# Patient Record
Sex: Male | Born: 1960 | Race: White | Hispanic: No | Marital: Married | Smoking: Former smoker
Health system: Southern US, Community
[De-identification: ages and names within clinical notes are randomized; demographics above are authoritative.]

## PROBLEM LIST (undated history)

## (undated) DIAGNOSIS — T8859XA Other complications of anesthesia, initial encounter: Secondary | ICD-10-CM

## (undated) DIAGNOSIS — E785 Hyperlipidemia, unspecified: Secondary | ICD-10-CM

## (undated) DIAGNOSIS — F419 Anxiety disorder, unspecified: Secondary | ICD-10-CM

## (undated) DIAGNOSIS — Z9889 Other specified postprocedural states: Secondary | ICD-10-CM

## (undated) DIAGNOSIS — R112 Nausea with vomiting, unspecified: Secondary | ICD-10-CM

## (undated) DIAGNOSIS — T4145XA Adverse effect of unspecified anesthetic, initial encounter: Secondary | ICD-10-CM

## (undated) HISTORY — DX: Hyperlipidemia, unspecified: E78.5

## (undated) HISTORY — DX: Anxiety disorder, unspecified: F41.9

## (undated) HISTORY — PX: HERNIA REPAIR: SHX51

---

## 2004-06-18 ENCOUNTER — Ambulatory Visit: Payer: Self-pay | Admitting: Internal Medicine

## 2004-07-28 ENCOUNTER — Ambulatory Visit: Payer: Self-pay | Admitting: Internal Medicine

## 2004-10-06 ENCOUNTER — Ambulatory Visit: Payer: Self-pay | Admitting: Internal Medicine

## 2004-10-29 ENCOUNTER — Ambulatory Visit: Payer: Self-pay | Admitting: Internal Medicine

## 2005-03-02 ENCOUNTER — Ambulatory Visit: Payer: Self-pay | Admitting: Internal Medicine

## 2007-01-16 ENCOUNTER — Telehealth (INDEPENDENT_AMBULATORY_CARE_PROVIDER_SITE_OTHER): Payer: Self-pay | Admitting: *Deleted

## 2007-01-17 ENCOUNTER — Ambulatory Visit: Payer: Self-pay | Admitting: Internal Medicine

## 2007-01-17 DIAGNOSIS — F419 Anxiety disorder, unspecified: Secondary | ICD-10-CM | POA: Insufficient documentation

## 2007-04-25 DIAGNOSIS — Z87898 Personal history of other specified conditions: Secondary | ICD-10-CM

## 2007-04-25 DIAGNOSIS — M542 Cervicalgia: Secondary | ICD-10-CM | POA: Insufficient documentation

## 2008-02-26 ENCOUNTER — Telehealth (INDEPENDENT_AMBULATORY_CARE_PROVIDER_SITE_OTHER): Payer: Self-pay | Admitting: *Deleted

## 2008-04-22 ENCOUNTER — Telehealth (INDEPENDENT_AMBULATORY_CARE_PROVIDER_SITE_OTHER): Payer: Self-pay | Admitting: *Deleted

## 2008-08-07 ENCOUNTER — Ambulatory Visit: Payer: Self-pay | Admitting: Internal Medicine

## 2008-08-07 DIAGNOSIS — IMO0001 Reserved for inherently not codable concepts without codable children: Secondary | ICD-10-CM

## 2010-08-20 ENCOUNTER — Other Ambulatory Visit: Payer: Self-pay | Admitting: Internal Medicine

## 2010-08-26 ENCOUNTER — Telehealth: Payer: Self-pay | Admitting: Internal Medicine

## 2010-08-26 NOTE — Telephone Encounter (Signed)
Patient says his pharmacy = [facility], [address], [place] says that we denied his prescription for Zoloft, but would not tell him why----he took last pill today---please call him as soon as possible with reason and new prescription

## 2010-08-27 MED ORDER — SERTRALINE HCL 50 MG PO TABS: 50.0000 mg | ORAL_TABLET | Freq: Every day | ORAL | Status: DC

## 2010-08-27 NOTE — Telephone Encounter (Signed)
Spoke w/ pt informed last seen 4/10 which is why rx was denied informed that office visit is needed to continue refilling medications. Pt said that he would call sometime next week and schedule appt. Informed pt that this is last refill until appt is scheduled pt ok'd information.

## 2011-05-25 ENCOUNTER — Encounter: Payer: Self-pay | Admitting: Internal Medicine

## 2011-05-25 ENCOUNTER — Ambulatory Visit (INDEPENDENT_AMBULATORY_CARE_PROVIDER_SITE_OTHER): Payer: Self-pay | Admitting: Internal Medicine

## 2011-05-25 DIAGNOSIS — K4090 Unilateral inguinal hernia, without obstruction or gangrene, not specified as recurrent: Secondary | ICD-10-CM

## 2011-05-25 DIAGNOSIS — F411 Generalized anxiety disorder: Secondary | ICD-10-CM

## 2011-05-25 DIAGNOSIS — R030 Elevated blood-pressure reading, without diagnosis of hypertension: Secondary | ICD-10-CM

## 2011-05-25 DIAGNOSIS — F419 Anxiety disorder, unspecified: Secondary | ICD-10-CM

## 2011-05-25 MED ORDER — BUPROPION HCL 75 MG PO TABS: 75.0000 mg | ORAL_TABLET | Freq: Two times a day (BID) | ORAL | Status: DC

## 2011-05-25 NOTE — Progress Notes (Signed)
  Subjective:    Patient ID: Trevor Hurst, male    DOB: 22-Feb-1961, 51 y.o.   MRN: [id number]  HPI  Hernia: Onset:03/23/11 Trigger/injury:noted incidentally in shower No pain, redness. Slight swelling ; more noticeable with cough Constitutional:No fever, chills, sweats, weight change Heme: No abnormal bruising or clotting, lymphadenopathy Treatment/response:none  Past medical history includes herniorrhaphy about 15 years ago on same side    Review of Systems He denies any genitourinary symptoms such as hematuria, pyuria, or dysuria. See blood pressure; he has never had hypertension. His blood pressure was 128/88 at the [employer] recently.  He weaned himself off sertraline in August of 2012. After that he started smoking in October because of work-related stresses. He did not feel that the sertraline was effective any longer. He has stopped smoking and is using a nicotine patch. He is questioning an alternative to sertraline. At this time he notes anxiety at work with slight panic and a feeling of diffuse heat. He denies a constellation of headache, chest pain, flushing, and diarrhea      Objective:   Physical Exam Gen.: Healthy and well-nourished in appearance. Alert, appropriate and cooperative throughout exam. Lungs: Normal respiratory effort; chest expands symmetrically. Lungs are clear to auscultation without rales, wheezes, or increased work of breathing. Heart: Normal rate and rhythm. Normal S1 and S2. No gallop, click, or rub. S 4 w/o  murmur. Abdomen: Bowel sounds normal; abdomen soft and nontender. No masses, organomegaly or hernias noted. Genitalia: Large direct hernia on the right which is partially reducible. 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                                                                                   Musculoskeletal/extremities:  No clubbing, cyanosis, edema, or deformity noted. Joints normal. Nail health  good. Vascular: Carotid, radial artery,  dorsalis pedis and  posterior tibial pulses are full and equal. No bruits present. No AAA Neurologic: Alert and oriented x3.  Skin: Intact without suspicious lesions or rashes. Lymph: No cervical, axillary, or inguinal lymphadenopathy present. Psych: Mood and affect are normal. Normally interactive                                                                                         Assessment & Plan:   #1 large direct hernia on the right; options discussed. He plans to consider surgery in the Spring   #2 elevated blood pressure without history of hypertension. Monitor is needed. Anxiety may be a component in the blood pressure changes.  #3 anxiety, job related.  Plan: See orders &  recommendations

## 2011-05-25 NOTE — Patient Instructions (Addendum)
Blood Pressure Goal  Ideally is an AVERAGE < 135/85. This AVERAGE should be calculated from @ least 5-7 BP readings taken @ different times of day on different days of week. You should not respond to isolated BP readings , but rather the AVERAGE for that week . Avoid heavy lifting because of  the hernia. Elective surgery can be scheduled whenever you would want to proceed. Please be seen immediately if there is persistent had severe pain in this area.

## 2011-06-07 ENCOUNTER — Ambulatory Visit (INDEPENDENT_AMBULATORY_CARE_PROVIDER_SITE_OTHER): Payer: PRIVATE HEALTH INSURANCE | Admitting: Internal Medicine

## 2011-06-07 ENCOUNTER — Encounter: Payer: Self-pay | Admitting: Internal Medicine

## 2011-06-07 DIAGNOSIS — F411 Generalized anxiety disorder: Secondary | ICD-10-CM

## 2011-06-07 DIAGNOSIS — G479 Sleep disorder, unspecified: Secondary | ICD-10-CM

## 2011-06-07 DIAGNOSIS — G478 Other sleep disorders: Secondary | ICD-10-CM

## 2011-06-07 LAB — T3, FREE: T3, Free: 3.5 pg/mL (ref 2.3–4.2)

## 2011-06-07 LAB — T4, FREE: Free T4: 0.93 ng/dL (ref 0.60–1.60)

## 2011-06-07 LAB — TSH: TSH: 0.56 u[IU]/mL (ref 0.35–5.50)

## 2011-06-07 MED ORDER — SERTRALINE HCL 50 MG PO TABS: 50.0000 mg | ORAL_TABLET | Freq: Every day | ORAL | Status: DC

## 2011-06-07 NOTE — Patient Instructions (Signed)
To prevent sleep dysfunction follow these instructions for sleep hygiene. Do not read, watch TV, or eat in bed. Do not get into bed until you are ready to turn off the light &  to go to sleep. Do not ingest stimulants ( decongestants, diet pills, nicotine, caffeine) after the evening meal.  The  24 hr urine studies will check for  increased production of stress hormones (metanephrines and catecholamines).

## 2011-06-07 NOTE — Progress Notes (Signed)
  Subjective:    Patient ID: Trevor Hurst, male    DOB: 10-02-1960, 51 y.o.   MRN: [id number]  HPI He feels that the bupropion has not been effective although his anxiety has decreased slightly with his medication. He describes an anxiety attack  2/2 & 2/3 with a "scared feeling". He denied associated chest pain, palpitations,flushing, diarrhea or shortness of breath. In fact he was able to walk 4 miles without any of these symptoms. Now in retrospect he feels the Zoloft has been effective . At the last visit he told me that it had lost it's  effectiveness; he now believes that the response to Zoloft was " fantastic". He has remained a nonsmoker. He does describe nightmares and sleep disruption. Blood pressure has been well-controlled in the range of 120- 130/80-88. There is no personal or family history of thyroid disorder. His sister does have depression.      Review of Systems  Abnormal sleep pattern Onset:1/31 Difficulty going to sleep:no Frequent awakening:no Early awakening:yes, 3-3:30 am Nightmares:yes Abnormal leg movement:no Snoring:no Apnea:no Risk factors/sleep hygiene: Stimulants:no Alcohol intake:no Reading, watching TV, eating @ bedtime:wife watches TV Daytime naps:no Stress/anxiety:work stress unchanged Work/travel factors:no Impact: Daytime hypersomnolence: no Motor vehicle accident/motor dysfunction:no Treatment to date/efficacy: no Rx      Objective:   Physical Exam  Gen.:  well-nourished; in no acute distress Eyes: Extraocular motion intact; no lid lag or proptosis Heart: Normal rhythm and rate without significant murmur, gallop, or extra heart sounds Neck: thyroid normal Lungs: Chest clear to auscultation without rales,rales, wheezes Neuro:Deep tendon reflexes are equal and within normal limits; no tremor  Skin: Warm and dry without significant lesions or rashes; no onycholysis Psych: Normally communicative and interactive; no abnormal mood or affect  clinically.         Assessment & Plan:

## 2011-06-07 NOTE — Assessment & Plan Note (Signed)
Symptoms he describes and suboptimal response to bupropion suggest significant serotonin deficiency. This is supported by his history of significant response to sertraline in the past. Surgery will be initiated and titrated to 50-100 mg a day as per response.  The pathophysiology of serotonin deficiency was discussed. Thyroid function tests and 24-hour urine for catecholamines and metanephrines indicated.

## 2011-06-14 ENCOUNTER — Telehealth: Payer: Self-pay

## 2011-06-14 NOTE — Telephone Encounter (Signed)
Patient made aware of labs and he voiced understanding    KP

## 2011-06-14 NOTE — Telephone Encounter (Signed)
Message copied by Maurice Small on Mon Jun 14, 2011  9:04 AM ------      Message from: Pecola Lawless      Created: Sat Jun 12, 2011 10:30 AM       TFTs normal;urine  stress hormone levels pending

## 2011-06-14 NOTE — Telephone Encounter (Signed)
Left message on voicemail for patient to return call when available   

## 2011-06-18 ENCOUNTER — Ambulatory Visit (INDEPENDENT_AMBULATORY_CARE_PROVIDER_SITE_OTHER): Payer: PRIVATE HEALTH INSURANCE | Admitting: Internal Medicine

## 2011-06-18 ENCOUNTER — Encounter: Payer: Self-pay | Admitting: Internal Medicine

## 2011-06-18 VITALS — BP 142/88 | HR 88 | Temp 98.2°F | Wt 231.1 lb

## 2011-06-18 DIAGNOSIS — R000 Tachycardia, unspecified: Secondary | ICD-10-CM

## 2011-06-18 DIAGNOSIS — F410 Panic disorder [episodic paroxysmal anxiety] without agoraphobia: Secondary | ICD-10-CM

## 2011-06-18 DIAGNOSIS — F411 Generalized anxiety disorder: Secondary | ICD-10-CM

## 2011-06-18 MED ORDER — LORAZEPAM 0.5 MG PO TABS: 0.5000 mg | ORAL_TABLET | Freq: Three times a day (TID) | ORAL | Status: AC | PRN

## 2011-06-18 NOTE — Assessment & Plan Note (Signed)
Lorazepam 0.5 every 8-12 hours as needed. 24 urines for catecholamines and metanephrines will be collected

## 2011-06-18 NOTE — Patient Instructions (Signed)
Avoid stimulants such as decongestants, diet pills, nicotine, or caffeine (coffee, tea, cola, or chocolate) to excess.

## 2011-06-18 NOTE — Progress Notes (Signed)
  Subjective:    Patient ID: Trevor Hurst, male    DOB: 1960/11/19, 51 y.o.   MRN: [id number]  HPI He titrated the sertraline 50 mg a day but developed paroxysmal sweating , agitation & marked sleep disturbance. This was the same phenomena noted with Wellbutrin  when it was titrated.  He describes anorexia with the anxiety episodes. Thyroid function tests were normal.  He has not collected a 24 urine for catecholamines and metanephrines as yet.    Review of Systems he denies a constellation of headaches, chest pain, flushing, and diarrhea.     Objective:   Physical Exam  Gen.:  well-nourished; in no acute distress Eyes: Extraocular motion intact; no lid lag or proptosis Neck: supple ; thyroid normal  Heart: Normal rhythm and rate without significant murmur, gallop, or extra heart sounds Lungs: Chest clear to auscultation without rales,rales, wheezes Neuro:Deep tendon reflexes are equal, brisk  & 1& 1/2 +; fine hand tremor  Skin: Warm and dry without significant lesions or rashes; no onycholysis Psych: Normally communicative and interactive; no abnormal mood or affect clinically.  MDQ screening : only 2 + answers        Assessment & Plan:  #1 anxiety disorder with paradoxic response to Wellbutrin as well as sertraline. The Mood Disorder questionnaire is negative with only 3 positive answers.  Plan: As necessary lorazepam for anxiety. I asked him to collect 24 urine 2/17 to-2/18 for catecholamines and metanephrines.

## 2011-06-21 NOTE — Progress Notes (Signed)
Addended by: Silvio Pate D on: 06/21/2011 04:42 PM   Modules accepted: Orders

## 2011-06-24 LAB — METANEPHRINES, URINE, 24 HOUR
Metaneph Total, Ur: 329 mcg/24 h (ref 182–739)
Normetanephrine, 24H Ur: 171 mcg/24 h (ref 88–649)

## 2011-06-26 LAB — CATECHOLAMINES, FRACTIONATED, URINE, 24 HOUR
Creatinine, Urine mg/day-CATEUR: 2.06 g/(24.h) (ref 0.63–2.50)
Epinephrine, 24 hr Urine: 7 mcg/24 h (ref 2–24)
Norepinephrine, 24 hr Ur: 27 mcg/24 h (ref 15–100)

## 2011-07-22 ENCOUNTER — Other Ambulatory Visit: Payer: Self-pay | Admitting: Internal Medicine

## 2011-07-22 NOTE — Telephone Encounter (Signed)
OK #30, R X2 

## 2011-07-22 NOTE — Telephone Encounter (Signed)
Refill for  Ativan 0.5mg  tab val Last filled 3.15.13 Last qty given 30

## 2011-07-22 NOTE — Telephone Encounter (Signed)
Last OV 06/18/2011, med last filled on 06/18/2011 #30 with 2 refills  Please advise if ok to fill and if yes how many additional refills

## 2011-07-23 MED ORDER — LORAZEPAM 0.5 MG PO TABS: 0.5000 mg | ORAL_TABLET | Freq: Three times a day (TID) | ORAL | Status: DC | PRN

## 2011-07-23 NOTE — Telephone Encounter (Signed)
RX sent

## 2011-09-15 ENCOUNTER — Telehealth: Payer: Self-pay | Admitting: Internal Medicine

## 2011-09-15 DIAGNOSIS — K469 Unspecified abdominal hernia without obstruction or gangrene: Secondary | ICD-10-CM

## 2011-09-15 NOTE — Telephone Encounter (Signed)
Patient needs to be seen tomorrow. Please arrange (here or @ [facility])

## 2011-09-15 NOTE — Telephone Encounter (Signed)
Order placed

## 2011-09-15 NOTE — Telephone Encounter (Signed)
Please  arrange a surgical referral w/  Dr.Gerkin

## 2011-09-15 NOTE — Telephone Encounter (Signed)
Caller: Cabe/Patient; PCP: Marga Melnick; CB#: [phone]; ; ; Call regarding Hernia Pain, Waiting On Referral; Called in to office this AM for referral, was told that if he had any problems or pain in that area to let MD know. Wants to know if he is ok to wait until he hears from the MD. Hernia is below belt line to groin area to right side. Onset 06/2011. Has bothered him on and off today. No pain at time of call back. Afebrile. All emergent sxs per Swollen Lymph Nodes protocol r/o. Home care advice given. Advised to FU with office in AM.

## 2011-09-15 NOTE — Telephone Encounter (Signed)
Pt states he is ready to have surgery on his hernia. He cannot remember the name of the doctor that was suggested (Gerkin?), but would [place] to proceed with scheduling him.

## 2011-09-15 NOTE — Telephone Encounter (Signed)
This message is ok to wait for Dr.Hopper, Dr.Hopper please advise

## 2011-09-16 ENCOUNTER — Ambulatory Visit (INDEPENDENT_AMBULATORY_CARE_PROVIDER_SITE_OTHER): Payer: PRIVATE HEALTH INSURANCE | Admitting: Internal Medicine

## 2011-09-16 VITALS — BP 148/90 | HR 94 | Temp 98.1°F | Wt 235.0 lb

## 2011-09-16 DIAGNOSIS — K469 Unspecified abdominal hernia without obstruction or gangrene: Secondary | ICD-10-CM

## 2011-09-16 NOTE — Progress Notes (Signed)
  Subjective:    Patient ID: Trevor Hurst, male    DOB: 1961-02-13, 51 y.o.   MRN: [id number]  HPI Acute visit, will see surgery in reference to the right inguinal hernia on June 20 but the patient was concerned because he had some mild pain in the area yesterday. Pain is spontaneously resolved within minutes. He noted the hernia around January 2013 and since then it has not grown  PMH: anxiety  Review of Systems Denies fever or chills No nausea vomiting or abdominal pain. Denies testicular pain.    Objective:   Physical Exam General -- alert, well-developed. No apparent distress.  GU-- Scrotal contents normal to palpation, penis normal as well. He does have a reducible, 4x3 cm hernia in the right suprapubic area. Nontender. Neurologic-- alert & oriented X3 and strength normal in all extremities. Psych-- Cognition and judgment appear intact. Alert and cooperative with normal attention span and concentration.  not anxious appearing and not depressed appearing.       Assessment & Plan:   Right inguinal hernia, to see surgery 10/21/2011. Discussed with the patient  symptoms such as sudden enlargement, pain, nausea vomiting or stomach discomfort. If he has any of those he will go to the emergency room for eval  of possible strangulation. He verbalized understanding

## 2011-09-16 NOTE — Telephone Encounter (Signed)
I called patient and offered appointment to be seen today at 11:15 by Dr.Paz. Patient will verify this is ok with his work schedule and if not patient will call back to cancel or reschedule

## 2011-09-30 ENCOUNTER — Telehealth: Payer: Self-pay | Admitting: Internal Medicine

## 2011-09-30 MED ORDER — LORAZEPAM 0.5 MG PO TABS: 0.5000 mg | ORAL_TABLET | Freq: Three times a day (TID) | ORAL | Status: DC | PRN

## 2011-09-30 NOTE — Telephone Encounter (Signed)
Refill: Lorazepam 0.5mg  tab

## 2011-09-30 NOTE — Telephone Encounter (Signed)
RX called in .

## 2011-10-21 ENCOUNTER — Ambulatory Visit (INDEPENDENT_AMBULATORY_CARE_PROVIDER_SITE_OTHER): Payer: 59 | Admitting: Surgery

## 2011-10-21 ENCOUNTER — Encounter (INDEPENDENT_AMBULATORY_CARE_PROVIDER_SITE_OTHER): Payer: Self-pay | Admitting: Surgery

## 2011-10-21 VITALS — BP 150/94 | HR 72 | Temp 98.2°F | Resp 18 | Ht 71.0 in | Wt 234.4 lb

## 2011-10-21 DIAGNOSIS — K4091 Unilateral inguinal hernia, without obstruction or gangrene, recurrent: Secondary | ICD-10-CM | POA: Insufficient documentation

## 2011-10-21 NOTE — Addendum Note (Signed)
Addended by: Andrey Campanile Ismail Graziani M on: 10/21/2011 09:46 AM   Modules accepted: Orders

## 2011-10-21 NOTE — Progress Notes (Addendum)
General Surgery [facility]  Chief Complaint  Patient presents with  . New Evaluation    Inguinal hernia, symptomatic - referral from Dr. Drue Novel and Dr. Alwyn Ren    HISTORY: Patient is a 51 year old white male referred by his primary care physician for symptomatic recurrent right inguinal hernia. Patient underwent repair of right inguinal hernia approximately 12 years ago in [place], [place]. Mesh was used in the repair. In December 2012 the patient noted a bulge in the groin while taking a shower. This has enlarged slightly. Patient was seen and evaluated by his primary physician in February. He has had occasional intermittent burning sensation. He has had no signs or symptoms of obstruction. He presents today for evaluation for repair of recurrent right inguinal hernia.  Past Medical History  Diagnosis Date  . Anxiety      Current Outpatient Prescriptions  Medication Sig Dispense Refill  . LORazepam (ATIVAN) 0.5 MG tablet Take 1 tablet (0.5 mg total) by mouth every 8 (eight) hours as needed.  30 tablet  1     No Known Allergies   Family History  Problem Relation Age of Onset  . Depression Sister   . Cancer Father      History   Social History  . Marital Status: Married    Spouse Name: N/A    Number of Children: N/A  . Years of Education: N/A   Social History Main Topics  . Smoking status: Former Smoker    Quit date: 05/04/2011  . Smokeless tobacco: None  . Alcohol Use: No  . Drug Use: No  . Sexually Active: None   Other Topics Concern  . None   Social History Narrative  . None     REVIEW OF SYSTEMS - PERTINENT POSITIVES ONLY: No signs or symptoms of obstruction. Occasional burning sensation. Slight enlargement over the last 6 months.  EXAM: Filed Vitals:   10/21/11 0857  BP: 150/94  Pulse: 72  Temp: 98.2 F (36.8 C)  Resp: 18    HEENT: normocephalic; pupils equal and reactive; sclerae clear; dentition good; mucous  membranes moist NECK:  symmetric on extension; no palpable anterior or posterior cervical lymphadenopathy; no supraclavicular masses; no tenderness CHEST: clear to auscultation bilaterally without rales, rhonchi, or wheezes CARDIAC: regular rate and rhythm without significant murmur; peripheral pulses are full ABDOMEN: soft without distension; bowel sounds present; no mass; no hepatosplenomegaly; no hernia GU:  Name male without lesion or mass. Obvious bulge in the right groin. Palpation in the inguinal canal shows a moderate-sized inguinal hernia sac. Hernia is reducible. It spontaneously prolapses. It augments with cough and Valsalva. Palpation in the left inguinal canal shows no sign of hernia. EXT:  non-tender without edema; no deformity NEURO: no gross focal deficits; no sign of tremor   LABORATORY RESULTS: See [facility] (CHL-Epic) for most recent results   RADIOLOGY RESULTS: See [facility] (CHL-Epic) for most recent results   IMPRESSION: Recurrent right inguinal hernia, reducible, mildly symptomatic  PLAN: I had a lengthy discussion with the patient regarding his recurrence of his right inguinal hernia. I provided him with written literature on both open and laparoscopic repair. I have asked my partner, Dr. Gaynelle Adu, to consult on the patient today. I think the patient would benefit from laparoscopic inguinal hernia repair. I believe the risk of complications would be less with this approach. I did not perform laparoscopic inguinal hernia repair. I have asked my partner Dr. Gaynelle Adu to take the  patient on his service and performed the repair in the near future. Both the patient and Dr. Andrey Campanile agreed to proceed.  The risks and benefits of the procedure have been discussed at length with the patient.  The patient understands the proposed procedure, potential alternative treatments, and the course of recovery to be expected.  All of the patient's questions have been  answered at this time.  The patient wishes to proceed with surgery.  Velora Heckler, MD, FACS General & Endocrine Surgery [facility]   PT SEEN & EXAMINED. HISTORY AS ABOVE. +RT INGUINAL HERNIA ON EXAM. NO EVID OF LIH  We discussed the etiology of inguinal hernias. We discussed the signs & symptoms of incarceration & strangulation.  We discussed non-operative and operative management.  The patient has elected to proceed with LAPAROSCOPIC REPAIR OF RECURRENT RIGHT INGUINAL HERNIA WITH MESH   I described the procedure in detail.  The patient was given educational material. We discussed the risks and benefits including but not limited to bleeding, infection, chronic inguinal pain, nerve entrapment, hernia recurrence, mesh complications, hematoma formation, urinary retention, injury to the testicles, numbness in the groin, blood clots, injury to the surrounding structures, and anesthesia risk. We also discussed the typical post operative recovery course, including no heavy lifting for 6 weeks. I explained that the likelihood of improvement of their symptoms is GOOD. I explained that he may be at slightly higher risk for chronic nerve pain since this is a recurrence.  Mary Sella. Andrey Campanile, MD, FACS General, Bariatric, & Minimally Invasive Surgery [facility]    Visit Diagnoses: 1. Inguinal hernia recurrent unilateral, right     Primary Care Physician: Marga Melnick, MD

## 2011-10-21 NOTE — Patient Instructions (Signed)
[facility]  UMBILICAL OR INGUINAL HERNIA REPAIR POST OP INSTRUCTIONS  Always review your discharge instruction sheet given to you by the facility where your surgery was performed.  1. A  prescription for pain medication may be given to you upon discharge.  Take your pain medication as prescribed, if needed.  If narcotic pain medicine is not needed, then you may take acetaminophen (Tylenol) or ibuprofen (Advil) as needed. 2. Take your usually prescribed medications unless otherwise directed. 3. If you need a refill on your pain medication, please contact your pharmacy.  They will contact our office to request authorization. Prescriptions will not be filled after 5 pm daily or on weekends. 4. You should follow a light diet the first 24 hours after arrival home, such as soup and crackers, etc.  Be sure to include lots of fluids daily.  Resume your normal diet the day after surgery. 5. Most patients will experience some swelling and bruising around the umbilicus or in the groin and scrotum.  Ice packs and reclining will help.  Swelling and bruising can take several days to resolve.  6. It is common to experience some constipation if taking pain medication after surgery.  Increasing fluid intake and taking a stool softener (such as Colace) will usually help or prevent this problem from occurring.  A mild laxative (Milk of Magnesia or Miralax) should be taken according to package directions if there are no bowel movements after 48 hours. 7. Unless discharge instructions indicate otherwise, you may remove your bandages 24-48 hours after surgery, and you may shower at that time.  You may have steri-strips (small skin tapes) in place directly over the incision.  These strips should be left on the skin for 7-10 days.  If your surgeon used skin glue on the incision, you may shower in 24 hours.  The glue will flake off over the next 2-3 weeks.  Any sutures or staples will be removed at the office  during your follow-up visit. 8. ACTIVITIES:  You may resume regular (light) daily activities beginning the next day--such as daily self-care, walking, climbing stairs--gradually increasing activities as tolerated.  You may have sexual intercourse when it is comfortable.  Refrain from any heavy lifting or straining until approved by your doctor.  You may drive when you are no longer taking prescription pain medication, you can comfortably wear a seatbelt, and you can safely maneuver your car and apply brakes. 9. You should see your doctor in the office for a follow-up appointment approximately 2-3 weeks after your surgery.  Make sure that you call for this appointment within a day or two after you arrive home to insure a convenient appointment time.  WHEN TO CALL YOUR DOCTOR: 1. Fever over 101.0 2. Inability to urinate 3. Nausea and/or vomiting 4. Extreme swelling or bruising 5. Continued bleeding from incision. 6. Increased pain, redness, or drainage from the incision The clinic staff is available to answer your questions during regular business hours.  Please don't hesitate to call and ask to speak to one of the nurses for clinical concerns.  If you have a medical emergency, go to the nearest emergency room or call 911.  A surgeon from [facility] is always on call at the hospital.   [address], [address], [place], [place]  [address]  [address] [address], [place], [place]   [address] [phone] ? [phone] ? FAX [phone] Website: [email or web address]   

## 2011-12-10 ENCOUNTER — Encounter (HOSPITAL_COMMUNITY): Payer: Self-pay | Admitting: Pharmacy Technician

## 2011-12-20 ENCOUNTER — Encounter (HOSPITAL_COMMUNITY)
Admission: RE | Admit: 2011-12-20 | Discharge: 2011-12-20 | Disposition: A | Discharge status: Home / Self Care | Payer: 59 | Source: Ambulatory Visit | Attending: General Surgery | Admitting: General Surgery

## 2011-12-20 ENCOUNTER — Encounter (HOSPITAL_COMMUNITY): Payer: Self-pay

## 2011-12-20 LAB — CBC
Hemoglobin: 16.3 g/dL (ref 13.0–17.0)
MCV: 85.1 fL (ref 78.0–100.0)
Platelets: 178 10*3/uL (ref 150–400)
RBC: 5.36 MIL/uL (ref 4.22–5.81)
WBC: 6 10*3/uL (ref 4.0–10.5)

## 2011-12-20 LAB — BASIC METABOLIC PANEL
CO2: 25 mEq/L (ref 19–32)
Calcium: 9.3 mg/dL (ref 8.4–10.5)
Chloride: 102 mEq/L (ref 96–112)
Glucose, Bld: 98 mg/dL (ref 70–99)
Potassium: 3.9 mEq/L (ref 3.5–5.1)
Sodium: 138 mEq/L (ref 135–145)

## 2011-12-20 LAB — DIFFERENTIAL
Eosinophils Relative: 1 % (ref 0–5)
Lymphocytes Relative: 22 % (ref 12–46)
Lymphs Abs: 1.3 10*3/uL (ref 0.7–4.0)
Monocytes Relative: 8 % (ref 3–12)

## 2011-12-20 LAB — SURGICAL PCR SCREEN
MRSA, PCR: NEGATIVE
Staphylococcus aureus: NEGATIVE

## 2011-12-20 NOTE — Pre-Procedure Instructions (Signed)
Pt bp 175/101, 158/102, and 151-92 at preop visit, pt instructed to monitor bp at home and notify primary md if bp bottom number remains over 90.

## 2011-12-20 NOTE — Patient Instructions (Signed)
20 Trevor Hurst  12/20/2011   Your procedure is scheduled on:  12-24-2011  Report to [facility] at 0530  AM.  Call this number if you have problems the morning of surgery: [phone]   Remember:   Do not eat food or drink liquids:After Midnight.  .  Take these medicines the morning of surgery with A SIP OF WATER: no meds to take   Do not wear jewelry or make up.  Do not wear lotions, powders, or perfumes.Do not wear deodorant.    Do not bring valuables to the hospital.  Contacts, dentures or bridgework may not be worn into surgery.  Leave suitcase in the car. After surgery it may be brought to your room.  For patients admitted to the hospital, checkout time is 11:00 AM the day  discharge                             Special Instructions: CHG Shower Use Special Wash: 1/2 bottle night before surgery and 1/2 bottle morning of surgery, use regular soap on face and front and back private area. You may shave your face morning of surgery.   Please read over the following fact sheets that you were given: MRSA Information  [facility] pre op nurse phone number [phone], call if needed

## 2011-12-20 NOTE — Pre-Procedure Instructions (Signed)
Pt given pre op instructions using the teach back method  

## 2011-12-23 HISTORY — PX: OTHER SURGICAL HISTORY: SHX169

## 2011-12-23 MED ORDER — CEFAZOLIN SODIUM-DEXTROSE 2-3 GM-% IV SOLR
2.0000 g | INTRAVENOUS | Status: AC
  Administered 2011-12-24: 2 g via INTRAVENOUS

## 2011-12-24 ENCOUNTER — Encounter (HOSPITAL_COMMUNITY)
Admission: RE | Disposition: A | Discharge status: Home / Self Care | Payer: Self-pay | Source: Ambulatory Visit | Attending: General Surgery

## 2011-12-24 ENCOUNTER — Ambulatory Visit (HOSPITAL_COMMUNITY): Payer: 59 | Admitting: Anesthesiology

## 2011-12-24 ENCOUNTER — Encounter (HOSPITAL_COMMUNITY): Payer: Self-pay | Admitting: Anesthesiology

## 2011-12-24 ENCOUNTER — Ambulatory Visit (HOSPITAL_COMMUNITY)
Admission: RE | Admit: 2011-12-24 | Discharge: 2011-12-24 | Disposition: A | Discharge status: Home / Self Care | Payer: 59 | Source: Ambulatory Visit | Attending: General Surgery | Admitting: General Surgery

## 2011-12-24 ENCOUNTER — Encounter (HOSPITAL_COMMUNITY): Payer: Self-pay

## 2011-12-24 DIAGNOSIS — K4091 Unilateral inguinal hernia, without obstruction or gangrene, recurrent: Secondary | ICD-10-CM | POA: Insufficient documentation

## 2011-12-24 DIAGNOSIS — K429 Umbilical hernia without obstruction or gangrene: Secondary | ICD-10-CM | POA: Insufficient documentation

## 2011-12-24 DIAGNOSIS — Z01810 Encounter for preprocedural cardiovascular examination: Secondary | ICD-10-CM | POA: Insufficient documentation

## 2011-12-24 DIAGNOSIS — K4090 Unilateral inguinal hernia, without obstruction or gangrene, not specified as recurrent: Secondary | ICD-10-CM | POA: Insufficient documentation

## 2011-12-24 DIAGNOSIS — F411 Generalized anxiety disorder: Secondary | ICD-10-CM | POA: Insufficient documentation

## 2011-12-24 DIAGNOSIS — Z01812 Encounter for preprocedural laboratory examination: Secondary | ICD-10-CM | POA: Insufficient documentation

## 2011-12-24 HISTORY — PX: INGUINAL HERNIA REPAIR: SHX194

## 2011-12-24 HISTORY — PX: UMBILICAL HERNIA REPAIR: SHX196

## 2011-12-24 SURGERY — REPAIR, HERNIA, INGUINAL, BILATERAL, LAPAROSCOPIC
Anesthesia: General | Site: Groin | Wound class: Clean

## 2011-12-24 MED ORDER — LACTATED RINGERS IV SOLN: INTRAVENOUS | Status: DC

## 2011-12-24 MED ORDER — PROMETHAZINE HCL 25 MG/ML IJ SOLN
6.2500 mg | Freq: Four times a day (QID) | INTRAMUSCULAR | Status: DC | PRN
  Administered 2011-12-24: 6.25 mg via INTRAVENOUS

## 2011-12-24 MED ORDER — SODIUM CHLORIDE 0.9 % IJ SOLN: 3.0000 mL | Freq: Two times a day (BID) | INTRAMUSCULAR | Status: DC

## 2011-12-24 MED ORDER — LACTATED RINGERS IV SOLN
INTRAVENOUS | Status: DC | PRN
  Administered 2011-12-24: 1000 mL

## 2011-12-24 MED ORDER — PROPOFOL 10 MG/ML IV EMUL
INTRAVENOUS | Status: DC | PRN
  Administered 2011-12-24: 250 mg via INTRAVENOUS

## 2011-12-24 MED ORDER — BUPIVACAINE-EPINEPHRINE PF 0.25-1:200000 % IJ SOLN
INTRAMUSCULAR | Status: AC
  Filled 2011-12-24: qty 30

## 2011-12-24 MED ORDER — GLYCOPYRROLATE 0.2 MG/ML IJ SOLN
INTRAMUSCULAR | Status: DC | PRN
  Administered 2011-12-24: .6 mg via INTRAVENOUS

## 2011-12-24 MED ORDER — OXYCODONE-ACETAMINOPHEN 5-325 MG PO TABS: 1.0000 | ORAL_TABLET | ORAL | Status: AC | PRN

## 2011-12-24 MED ORDER — FENTANYL CITRATE 0.05 MG/ML IJ SOLN
INTRAMUSCULAR | Status: DC | PRN
  Administered 2011-12-24 (×4): 50 ug via INTRAVENOUS

## 2011-12-24 MED ORDER — MORPHINE SULFATE 10 MG/ML IJ SOLN: 1.0000 mg | INTRAMUSCULAR | Status: DC | PRN

## 2011-12-24 MED ORDER — ONDANSETRON HCL 4 MG/2ML IJ SOLN
INTRAMUSCULAR | Status: DC | PRN
  Administered 2011-12-24: 4 mg via INTRAVENOUS

## 2011-12-24 MED ORDER — MIDAZOLAM HCL 5 MG/5ML IJ SOLN
INTRAMUSCULAR | Status: DC | PRN
  Administered 2011-12-24: 2 mg via INTRAVENOUS

## 2011-12-24 MED ORDER — SODIUM CHLORIDE 0.9 % IV SOLN: 250.0000 mL | INTRAVENOUS | Status: DC | PRN

## 2011-12-24 MED ORDER — OXYCODONE HCL 5 MG PO TABS: 5.0000 mg | ORAL_TABLET | ORAL | Status: DC | PRN

## 2011-12-24 MED ORDER — LIDOCAINE HCL (CARDIAC) 20 MG/ML IV SOLN
INTRAVENOUS | Status: DC | PRN
  Administered 2011-12-24: 100 mg via INTRAVENOUS

## 2011-12-24 MED ORDER — HYDROMORPHONE HCL PF 1 MG/ML IJ SOLN
INTRAMUSCULAR | Status: DC | PRN
  Administered 2011-12-24: 0.5 mg via INTRAVENOUS
  Administered 2011-12-24: 1 mg via INTRAVENOUS
  Administered 2011-12-24: 0.5 mg via INTRAVENOUS

## 2011-12-24 MED ORDER — CEFAZOLIN SODIUM-DEXTROSE 2-3 GM-% IV SOLR
INTRAVENOUS | Status: AC
  Filled 2011-12-24: qty 50

## 2011-12-24 MED ORDER — LACTATED RINGERS IV SOLN
INTRAVENOUS | Status: DC | PRN
  Administered 2011-12-24 (×2): via INTRAVENOUS

## 2011-12-24 MED ORDER — NEOSTIGMINE METHYLSULFATE 1 MG/ML IJ SOLN
INTRAMUSCULAR | Status: DC | PRN
  Administered 2011-12-24: 4 mg via INTRAVENOUS

## 2011-12-24 MED ORDER — ACETAMINOPHEN 10 MG/ML IV SOLN
INTRAVENOUS | Status: DC | PRN
  Administered 2011-12-24: 1000 mg via INTRAVENOUS

## 2011-12-24 MED ORDER — HYDROMORPHONE HCL PF 1 MG/ML IJ SOLN: 0.2500 mg | INTRAMUSCULAR | Status: DC | PRN

## 2011-12-24 MED ORDER — PROMETHAZINE HCL 25 MG/ML IJ SOLN
6.2500 mg | INTRAMUSCULAR | Status: DC | PRN
  Filled 2011-12-24: qty 1

## 2011-12-24 MED ORDER — ROCURONIUM BROMIDE 100 MG/10ML IV SOLN
INTRAVENOUS | Status: DC | PRN
  Administered 2011-12-24: 10 mg via INTRAVENOUS
  Administered 2011-12-24: 50 mg via INTRAVENOUS
  Administered 2011-12-24: 20 mg via INTRAVENOUS
  Administered 2011-12-24: 5 mg via INTRAVENOUS

## 2011-12-24 MED ORDER — ACETAMINOPHEN 325 MG PO TABS: 650.0000 mg | ORAL_TABLET | ORAL | Status: DC | PRN

## 2011-12-24 MED ORDER — BUPIVACAINE-EPINEPHRINE 0.25% -1:200000 IJ SOLN
INTRAMUSCULAR | Status: DC | PRN
  Administered 2011-12-24: 45 mL

## 2011-12-24 MED ORDER — ACETAMINOPHEN 650 MG RE SUPP
650.0000 mg | RECTAL | Status: DC | PRN
  Filled 2011-12-24: qty 1

## 2011-12-24 MED ORDER — SODIUM CHLORIDE 0.9 % IJ SOLN: 3.0000 mL | INTRAMUSCULAR | Status: DC | PRN

## 2011-12-24 MED ORDER — ONDANSETRON HCL 4 MG/2ML IJ SOLN
4.0000 mg | Freq: Four times a day (QID) | INTRAMUSCULAR | Status: DC | PRN
  Administered 2011-12-24: 4 mg via INTRAVENOUS
  Filled 2011-12-24: qty 2

## 2011-12-24 MED ORDER — ACETAMINOPHEN 10 MG/ML IV SOLN
INTRAVENOUS | Status: AC
  Filled 2011-12-24: qty 100

## 2011-12-24 SURGICAL SUPPLY — 40 items
ADH SKN CLS APL DERMABOND .7 (GAUZE/BANDAGES/DRESSINGS) ×4
APL SKNCLS STERI-STRIP NONHPOA (GAUZE/BANDAGES/DRESSINGS)
BANDAGE ADHESIVE 1X3 (GAUZE/BANDAGES/DRESSINGS) IMPLANT
BENZOIN TINCTURE PRP APPL 2/3 (GAUZE/BANDAGES/DRESSINGS) IMPLANT
CANISTER SUCTION 2500CC (MISCELLANEOUS) ×5 IMPLANT
CLOTH BEACON ORANGE TIMEOUT ST (SAFETY) ×5 IMPLANT
DECANTER SPIKE VIAL GLASS SM (MISCELLANEOUS) ×5 IMPLANT
DERMABOND ADVANCED (GAUZE/BANDAGES/DRESSINGS) ×1
DERMABOND ADVANCED .7 DNX12 (GAUZE/BANDAGES/DRESSINGS) ×1 IMPLANT
DEVICE SECURE STRAP 25 ABSORB (INSTRUMENTS) ×7 IMPLANT
DRAPE LAPAROSCOPIC ABDOMINAL (DRAPES) ×5 IMPLANT
DRAPE UTILITY XL STRL (DRAPES) ×5 IMPLANT
DRSG TEGADERM 2-3/8X2-3/4 SM (GAUZE/BANDAGES/DRESSINGS) IMPLANT
DRSG TEGADERM 4X4.75 (GAUZE/BANDAGES/DRESSINGS) IMPLANT
ELECT REM PT RETURN 9FT ADLT (ELECTROSURGICAL) ×5
ELECTRODE REM PT RTRN 9FT ADLT (ELECTROSURGICAL) ×4 IMPLANT
GLOVE BIO SURGEON STRL SZ7.5 (GLOVE) ×5 IMPLANT
GLOVE BIOGEL M STRL SZ7.5 (GLOVE) IMPLANT
GLOVE INDICATOR 8.0 STRL GRN (GLOVE) ×5 IMPLANT
GOWN STRL NON-REIN LRG LVL3 (GOWN DISPOSABLE) ×5 IMPLANT
GOWN STRL REIN XL XLG (GOWN DISPOSABLE) ×10 IMPLANT
KIT BASIN OR (CUSTOM PROCEDURE TRAY) ×5 IMPLANT
MESH ULTRAPRO 3X6 7.6X15CM (Mesh General) ×2 IMPLANT
MESH VENTRALIGHT ST 4X6IN (Mesh General) ×2 IMPLANT
NS IRRIG 1000ML POUR BTL (IV SOLUTION) ×5 IMPLANT
PATCH VENTRAL MEDIUM 6.4 (Mesh Specialty) ×2 IMPLANT
SCISSORS LAP 5X35 DISP (ENDOMECHANICALS) ×2 IMPLANT
SET IRRIG TUBING LAPAROSCOPIC (IRRIGATION / IRRIGATOR) IMPLANT
SHEARS CURVED HARMONIC AC 45CM (MISCELLANEOUS) IMPLANT
SOLUTION ANTI FOG 6CC (MISCELLANEOUS) ×5 IMPLANT
STRIP CLOSURE SKIN 1/2X4 (GAUZE/BANDAGES/DRESSINGS) IMPLANT
SUT MNCRL AB 4-0 PS2 18 (SUTURE) ×5 IMPLANT
SUT NOVA NAB GS-21 0 18 T12 DT (SUTURE) ×4 IMPLANT
SUT VIC AB 3-0 SH 18 (SUTURE) IMPLANT
SUT VIC AB 3-0 SH 8-18 (SUTURE) ×2 IMPLANT
TOWEL OR 17X26 10 PK STRL BLUE (TOWEL DISPOSABLE) ×5 IMPLANT
TRAY LAP CHOLE (CUSTOM PROCEDURE TRAY) ×5 IMPLANT
TROCAR BLADELESS OPT 5 75 (ENDOMECHANICALS) ×10 IMPLANT
TROCAR XCEL BLUNT TIP 100MML (ENDOMECHANICALS) ×5 IMPLANT
TUBING INSUFFLATION 10FT LAP (TUBING) ×5 IMPLANT

## 2011-12-24 NOTE — Op Note (Signed)
[name], SLEIGHT NO.:  [id number]  MEDICAL RECORD NO.:  [id number]  LOCATION:  [facility]                         FACILITY:  [facility]  PHYSICIAN:  Mary Sella. Andrey Campanile, MD, FACSDATE OF BIRTH:  [birth year]  DATE OF PROCEDURE:  12/24/2011 DATE OF DISCHARGE:                              OPERATIVE REPORT   PREOPERATIVE DIAGNOSIS:  Recurrent right inguinal hernia.  POSTOPERATIVE DIAGNOSES: 1. Recurrent right direct inguinal hernia. 2. Left direct inguinal hernia. 3. Umbilical hernia.  PROCEDURE: 1. Laparoscopic repair of right recurrent direct inguinal hernia with     mesh. 2. Laparoscopic repair of left direct inguinal hernia with mesh. 3. Open repair of umbilical hernia with mesh.  SURGEON:  Mary Sella. Andrey Campanile, MD, FACS.  ASSISTANT:  None.  ANESTHESIA:  General plus local consisting of 0.25% Marcaine with epi.  ESTIMATED BLOOD LOSS:  Minimal.  FINDINGS:  The patient had recurrent hernia on the right medial to the inferior epigastric vessels consistent with a direct hernia.  The old mesh was visible.  In stripping the peritoneal flap down on the right, it was quite adherent and tethered to the old mesh creating a large defect in the peritoneal flap.  Therefore, a 4 inch x 6 inch piece of Bard Ventralex ST mesh was placed.  The contralateral hernia in the left groin was a direct hernia as well.  It was repaired with a 4 inch x 6 inch piece of Ethicon UltraPro mesh.  The umbilical hernia since the defect was approximately 2 cm, was buttressed with a 6-cm round piece of Ethicon Proceed ventral patch mesh underlay with the fascia closed on top of that.  INDICATIONS FOR PROCEDURE:  The patient is a very pleasant, obese 51- year-old Caucasian male, who had a right inguinal hernia repair more than 10 years ago, who developed recurrence few months ago.  He saw one of my partners, who referred him to me for laparoscopic repair.  He did have a small umbilical hernia as well.   We discussed the risks and benefits of surgery including, but not limited to, bleeding, infection, injury to surrounding structures, hematoma formation, seroma formation, testicular atrophy and loss, hernia recurrence, chronic inguinal pain, blood clot formation, urinary retention, need to convert to an open procedure, blood clot formation, and general anesthesia risk.  We explained that he is at higher risk for recurrence since this is a recurrent hernia as well as high risk for chronic inguinal pain, due to the fact that he has had a prior repair.  He elects to proceed to the operating room.  DESCRIPTION OF PROCEDURE:  After obtaining informed consent and the patient emptying his bladder, and putting my initials on the right side, the patient was taken back to the operating room at [facility], placed supine on the operating table.  General endotracheal anesthesia was established.  Sequential compression devices were placed. Both arms were tucked.  He received antibiotics prior to skin incision. His abdomen was prepped and draped in usual standard surgical fashion with ChloraPrep.  His umbilical hernia has gotten a little bit bigger since I last saw him in June, now grossly measured over 1 cm externally. I  made a transverse infraumbilical curvilinear incision with a #11 blade after local had been infiltrated.  The [name] was then passed up and around the umbilicus and the base of the umbilicus was removed from the hernia sac.  This opened up the umbilical hernia defect which was about 2 cm wide.  At this point, I placed a pursestring suture of 0 Vicryl around the fascial defect and placed a 12 mm Hasson trocar. Pneumoperitoneum was smoothly established up to a patient pressure of 15 mmHg.  Laparoscope was advanced.  The abdominal cavity was surveilled. There was no evidence of injury to surrounding structures.  The patient was placed in Trendelenburg position.  The right groin  was surveilled. There was evidence of his old mesh.  On the left, there was a defect medial to the inferior epigastric vessels consistent with direct hernia. At this point, I had the circulator nurse call and called the patient's wife in the holding area, and I discussed the intraoperative findings including the large umbilical hernia and the groin hernia on the left.  We discussed observation versus repairing it at this time. After discussing the risks and benefits of proceeding, the patient's wife elected for me to proceed with repair of both of the additional hernias.  I placed 2 additional 5 mm trocars, one in the left and one in the right midclavicular line under direct visualization after local had been infiltrated.  I then made a peritoneal flap on the right by incising the peritoneum starting about 2 inches above the anterior superior iliac spine and incising with EndoShear and carried medially towards the median umbilical ligament.  The peritoneum was easily stripped from the anterior abdominal wall.  The inferior epigastric vessel was identified.  The direct hernia was reduced and a plug of fat was pulled back from the peritoneum.  The inferior gastric vessels again identified and preserved.  The peritoneum was densely adhered to the mesh out laterally.  A large defect was made in the peritoneal flap in getting it down.  I was then able to create a large pocket.  In reviewing and examining the peritoneal flap, the defect was quite large and I was unsure if I was going to be able to close it and repair the defect, therefore I elected to use a piece of Bard Ventralex ST mesh 4 inch x 6 inch.  I marked the mesh confirming the bowel side.  The mesh was placed into the groin through the Hasson trocar.  The mesh was uncurled and placed flat at the right groin where half of the mesh covered the direct defect and half covered laterally.  The direct defect was well covered.  Then using  an Ethicon secure strap tacker, I tacked the mesh to the abdominal wall, one in the Cooper's ligament, one on each side of the inferior epigastric vessel and one out laterally.  An additional tack was placed at the very superior medial aspect of the mesh, for a total of 5 tacks in that mesh.  I then turned my attention to the contralateral side.  Again, lazy S incision was made into the peritoneum on the left about in the same position as on the right.  The peritoneal flap was dissected down from the anterior abdominal wall taking care not to injure the inferior epigastric vessels.  The direct hernia was reduced in its entirety.  The vas deferens and the testicular vessels were identified and preserved.  The large pocket was created. Since I  had not made the defect in the peritoneal flap, I used a 3 inch x 6 inch piece of Ethicon UltraPro mesh, placed it through the Hasson trocar and into the left groin.  Half of the mesh covered medial to the inferior epigastric vessels and half laterally.  The mesh was secured into the abdominal wall with a secure strap tack one in Cooper's ligament, two  medially and one on each side of the inferior epigastric vessels and one out laterally.  Intra-abdominal pressure was reduced to 8 mmHg.  I then reapproximated the peritoneal flap on the left back to the anterior abdominal wall with 4 tacks.  I then reapproximated the peritoneal flap on the right with 4 tacks as well.  I was able to close most of the defect, however, there was still a gap in the peritoneum on the right, however, because I used Bard VentralightST mesh, I elected to leave it alone.  It should be noted that prior to placing the mesh, I did infiltrate local along the groin under direct laparoscopic visualization.  At this point, the Hasson trocar was removed.  A purse-string suture was removed.  I ended up lifting the fat off of the umbilical fascia for about 1 cm in a circumferential  manner.  I then obtained a piece of Ethicon Proceed mesh 6.4 cm, placed it through the fascial defect lifted up both tabs so that the mesh was flushed with the abdominal wall.  I then using a 0 Novafil suture, sutured at the base of the tab to each edge of the fascia.  We returned laparoscopically and confirmed that the mesh was completely flat against the abdominal wall.  There was no redundancy in it.  I then placed an additional 0 Novafil through each of the tabs.  The excess tab was trimmed.  I then closed the umbilical fascia in a transverse manner with 4 interrupted 0 Novafil sutures.  The subcutaneous tissue was irrigated.  I then tacked the base of the umbilical stalk back down to the umbilicus fascia with 2 interrupted 3-0 Vicryl sutures.  The deep dermis was reapproximated with inverted interrupted 3-0 Vicryl sutures and the skin was closed with 4-0 Monocryl.  It should be noted that local was infiltrated along the umbilical fascia prior to closure.  The 2 remaining trocar sites were closed with 4-0 Monocryl in a subcuticular fashion, and Dermabond was applied to the skin incisions.  The patient was extubated and taken to recovery in stable condition.  There were no immediate complications. The patient tolerated procedure well.     Mary Sella. Andrey Campanile, MD, FACS     EMW/MEDQ  D:  12/24/2011  T:  12/24/2011  Job:  [id number]

## 2011-12-24 NOTE — Brief Op Note (Signed)
12/24/2011  10:08 AM  PATIENT:  Trevor Hurst  51 y.o. male  PRE-OPERATIVE DIAGNOSIS:  recurrent right inguinal hernia  POST-OPERATIVE DIAGNOSIS:  recurrent right direct inguinal hernia and umbilical hernia and left direct inguinal hernia  PROCEDURE:  Procedure(s) (LRB): INSERTION OF MESH (Bilateral) HERNIA REPAIR UMBILICAL ADULT (N/A) LAPAROSCOPIC BILATERAL INGUINAL HERNIA REPAIR ()  SURGEON:  Surgeon(s) and Role:    * Atilano Ina, MD,FACS - Primary  PHYSICIAN ASSISTANT: none  ASSISTANTS: none   ANESTHESIA:   local and general  FINDINGS: recurrent right direct inguinal hernia - repaired with 4 x6 in Bard Ventralight ST mesh Left direct inguinal hernia - repaired with 4 x6 in ethicon ultrapro mesh Umbilical hernia -defect about 2cm; 6 cm Ethicon Proceed ventral patch mesh  EBL:  Total I/O In: 1000 [I.V.:1000] Out: 35 [Other:25; Blood:10]  BLOOD ADMINISTERED:none  DRAINS: none   LOCAL MEDICATIONS USED:  MARCAINE     SPECIMEN:  No Specimen  DISPOSITION OF SPECIMEN:  N/A  COUNTS:  YES  TOURNIQUET:  * No tourniquets in log *  DICTATION: .Other Dictation: Dictation Number [id number]  PLAN OF CARE: Discharge to home after PACU  PATIENT DISPOSITION:  PACU - hemodynamically stable.   Delay start of Pharmacological VTE agent (>24hrs) due to surgical blood loss or risk of bleeding: not applicable

## 2011-12-24 NOTE — Transfer of Care (Signed)
Immediate Anesthesia Transfer of Care Note  Patient: Trevor Hurst  Procedure(s) Performed: Procedure(s) (LRB): INSERTION OF MESH (Bilateral) HERNIA REPAIR UMBILICAL ADULT (N/A) LAPAROSCOPIC BILATERAL INGUINAL HERNIA REPAIR ()  Patient Location: PACU  Anesthesia Type: General  Level of Consciousness: awake, sedated and patient cooperative  Airway & Oxygen Therapy: Patient Spontanous Breathing and Patient connected to face mask oxygen  Post-op Assessment: Report given to PACU RN and Patient moving all extremities X 4  Post vital signs: Reviewed and stable  Complications: No apparent anesthesia complications2

## 2011-12-24 NOTE — Anesthesia Preprocedure Evaluation (Signed)
Anesthesia Evaluation    Airway Mallampati: II TM Distance: >3 FB Neck ROM: Full    Dental  (+) Teeth Intact and Dental Advisory Given   Pulmonary  breath sounds clear to auscultation  Pulmonary exam normal       Cardiovascular Rhythm:Regular Rate:Normal     Neuro/Psych Anxiety Chronic neck pain  Neuromuscular disease    GI/Hepatic   Endo/Other    Renal/GU      Musculoskeletal   Abdominal   Peds  Hematology   Anesthesia Other Findings   Reproductive/Obstetrics                           Anesthesia Physical Anesthesia Plan  ASA: I  Anesthesia Plan: General   Post-op Pain Management:    Induction: Intravenous  Airway Management Planned: Oral ETT  Additional Equipment:   Intra-op Plan:   Post-operative Plan: Extubation in OR  Informed Consent: I have reviewed the patients History and Physical, chart, labs and discussed the procedure including the risks, benefits and alternatives for the proposed anesthesia with the patient or authorized representative who has indicated his/her understanding and acceptance.   Dental advisory given  Plan Discussed with: CRNA  Anesthesia Plan Comments:         Anesthesia Quick Evaluation

## 2011-12-24 NOTE — H&P (Signed)
Trevor Hurst is an 51 y.o. male.   Chief Complaint: here for surgery HPI: 51 yo WM with recurrent RIH. Here for surgery. Seen in office in late June. Denies any new medical problems, meds, surgeries.  PMHx, PSHx, SOCHx, FAMHx, ALL reviewed and unchanged   Past Medical History  Diagnosis Date  . Anxiety     Past Surgical History  Procedure Date  . Hernia repair 12 yrs ago    R   . Warts 12/23/11    dermatologist froze them on back calfs bilaterally    Family History  Problem Relation Age of Onset  . Depression Sister   . Cancer Father    Social History:  reports that he quit smoking about 8 months ago. He has never used smokeless tobacco. He reports that he does not drink alcohol or use illicit drugs.  Allergies: No Known Allergies  Medications Prior to Admission  Medication Sig Dispense Refill  . LORazepam (ATIVAN) 0.5 MG tablet Take 0.5 mg by mouth at bedtime.        No results found for this or any previous visit (from the past 48 hour(s)). No results found.  Review of Systems  Constitutional: Negative for fever and chills.  Respiratory: Negative for shortness of breath.   Cardiovascular: Negative for chest pain.  Gastrointestinal: Negative for nausea, vomiting and abdominal pain.  Genitourinary: Negative for dysuria and urgency.  Musculoskeletal: Negative for back pain.  Neurological: Negative for seizures and loss of consciousness.  Psychiatric/Behavioral: Negative for substance abuse.  All other systems reviewed and are negative.    Blood pressure 150/89, pulse 85, temperature 98.5 F (36.9 C), temperature source Oral, resp. rate 16, SpO2 100.00%. Physical Exam  Constitutional: He is oriented to person, place, and time. He appears well-developed and well-nourished. No distress.       overweight  HENT:  Head: Normocephalic and atraumatic.  Right Ear: External ear normal.  Left Ear: External ear normal.  Eyes: Conjunctivae are normal. No scleral icterus.   Neck: Normal range of motion. Neck supple.  Cardiovascular: Normal rate.   Respiratory: Effort normal. No respiratory distress.  GI: Soft. He exhibits no distension. There is no tenderness.       Small umbilical hernia; recurrent RIH  Genitourinary: Testes normal and penis normal.  Musculoskeletal: He exhibits no edema and no tenderness.  Neurological: He is alert and oriented to person, place, and time.  Skin: Skin is warm and dry. No rash noted. He is not diaphoretic. No erythema.  Psychiatric: He has a normal mood and affect. His behavior is normal. Judgment and thought content normal.     Assessment/Plan Recurrent RIH  TO or for laparoscopic repair. All questions asked and answered.   Mary Sella. Andrey Campanile, MD, FACS General, Bariatric, & Minimally Invasive Surgery [facility], [place]   [facility] M 12/24/2011, 7:30 AM

## 2011-12-24 NOTE — Anesthesia Postprocedure Evaluation (Signed)
Anesthesia Post Note  Patient: Trevor Hurst  Procedure(s) Performed: Procedure(s) (LRB): INSERTION OF MESH (Bilateral) HERNIA REPAIR UMBILICAL ADULT (N/A) LAPAROSCOPIC BILATERAL INGUINAL HERNIA REPAIR ()  Anesthesia type: General  Patient location: PACU  Post pain: Pain level controlled  Post assessment: Post-op Vital signs reviewed  Last Vitals:  Filed Vitals:   12/24/11 1045  BP: 144/92  Pulse: 66  Temp: 36.4 C  Resp: 12    Post vital signs: Reviewed  Level of consciousness: sedated  Complications: No apparent anesthesia complications

## 2011-12-27 ENCOUNTER — Encounter (HOSPITAL_COMMUNITY): Payer: Self-pay | Admitting: General Surgery

## 2011-12-27 ENCOUNTER — Telehealth (INDEPENDENT_AMBULATORY_CARE_PROVIDER_SITE_OTHER): Payer: Self-pay | Admitting: General Surgery

## 2011-12-27 NOTE — Telephone Encounter (Signed)
Pt calling to report he is not taking the narcotic pain med because he says "it knocks me out."  He has been taking 400 mg ibuprofen Q4H.  Advised pt to only use ibuprofen Q6H, but can take up to 600mg  at at time.  He understands.  Offered Vicodin, which he declines, saying the ibuprofen is sufficient.

## 2011-12-30 ENCOUNTER — Other Ambulatory Visit: Payer: Self-pay | Admitting: *Deleted

## 2011-12-30 MED ORDER — LORAZEPAM 0.5 MG PO TABS: 0.5000 mg | ORAL_TABLET | Freq: Every day | ORAL | Status: DC

## 2011-12-30 NOTE — Telephone Encounter (Signed)
Rx sent 

## 2011-12-30 NOTE — Telephone Encounter (Signed)
OK #30 , R X1 

## 2011-12-30 NOTE — Telephone Encounter (Signed)
06-18-11 Last OV, Last filled  #30 2

## 2011-12-31 ENCOUNTER — Telehealth (INDEPENDENT_AMBULATORY_CARE_PROVIDER_SITE_OTHER): Payer: Self-pay | Admitting: General Surgery

## 2011-12-31 NOTE — Telephone Encounter (Signed)
Message copied by Liliana Cline on Fri Dec 31, 2011  1:31 PM ------      Message from: Cathi Roan      Created: Fri Dec 31, 2011 10:03 AM      Regarding: Return to work       [phone] Patient needs return to work note please fax to [phone] Please notify when you send.

## 2011-12-31 NOTE — Telephone Encounter (Signed)
Patient wants to return to work on 01/04/12. 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 Note faxed per patient request.

## 2012-01-18 ENCOUNTER — Ambulatory Visit (INDEPENDENT_AMBULATORY_CARE_PROVIDER_SITE_OTHER): Payer: 59 | Admitting: General Surgery

## 2012-01-18 ENCOUNTER — Encounter (INDEPENDENT_AMBULATORY_CARE_PROVIDER_SITE_OTHER): Payer: Self-pay | Admitting: General Surgery

## 2012-01-18 VITALS — BP 136/90 | HR 64 | Temp 97.6°F | Resp 16 | Ht 71.0 in | Wt 236.0 lb

## 2012-01-18 DIAGNOSIS — Z09 Encounter for follow-up examination after completed treatment for conditions other than malignant neoplasm: Secondary | ICD-10-CM

## 2012-01-18 NOTE — Patient Instructions (Signed)
You can resume full activities after Oct 4

## 2012-01-18 NOTE — Progress Notes (Signed)
Subjective:     Patient ID: Trevor Hurst, male   DOB: [birth year], 51 y.o.   MRN: [id number]  HPI 51 yo WM s/p laparoscopic repair of recurrent (direct) Right inguinal hernia and left direct inguinal hernia with mesh & open umbilical hernia repair with mesh on 8/23 comes in for follow-up. He states that he is doing great. He states that he had much less pain after this surgery than after he did after his initial right inguinal hernia repair. He denies any fever, chills, nausea, vomiting, diarrhea or constipation. He denies any dysuria. He denies any numbness or tingling in his groin. He states that occasionally his right testicle will feel bruised. He states he did have some burning in his left hip area last week but it has resolved. He went to work about a week after surgery. He only took narcotics for 2-3 days after surgery.  Review of Systems     Objective:   Physical Exam BP 136/90  Pulse 64  Temp 97.6 F (36.4 C) (Temporal)  Resp 16  Ht 5\' 11"  (1.803 m)  Wt 236 lb (107.049 kg)  BMI 32.92 kg/m2  Gen: alert, NAD, non-toxic appearing Abd: soft, nontender, nondistended. Well-healed trocar sites. No cellulitis. No incisional hernia GU: both testicles descended, no scrotal masses, no sign of hernia recurrence Ext: no edema     Assessment:     Status post laparoscopic repair of recurrent right inguinal hernia, left direct inguinal hernia with mesh and open umbilical hernia repair with mesh on August 23    Plan:     He appears to be doing well. I advised him he can resume light cardiovascular activity. I reminded him he should not do full strength activity until after October 5 which will be about 6 weeks after surgery. Followup as needed  Mary Sella. Andrey Campanile, MD, FACS General, Bariatric, & Minimally Invasive Surgery [facility], [place]

## 2012-02-21 ENCOUNTER — Other Ambulatory Visit: Payer: Self-pay | Admitting: Internal Medicine

## 2012-02-21 MED ORDER — LORAZEPAM 0.5 MG PO TABS: 0.5000 mg | ORAL_TABLET | Freq: Every day | ORAL | Status: DC

## 2012-02-21 NOTE — Telephone Encounter (Signed)
REFILL LORAZEPAM 0.5MG  TAB --LAST FILL 9.27.13-LAST OV 5.16.13 acute wt/paz

## 2012-02-21 NOTE — Telephone Encounter (Signed)
RX called in .

## 2012-03-14 ENCOUNTER — Telehealth: Payer: Self-pay | Admitting: Internal Medicine

## 2012-03-14 DIAGNOSIS — Z0000 Encounter for general adult medical examination without abnormal findings: Secondary | ICD-10-CM

## 2012-03-14 NOTE — Telephone Encounter (Signed)
Dr. Alwyn Ren please advise on DX code

## 2012-03-14 NOTE — Telephone Encounter (Signed)
V70.0, wellness form for work

## 2012-03-14 NOTE — Telephone Encounter (Signed)
Dr.Hopper please advise, ? If ok to give orders for labs only

## 2012-03-14 NOTE — Telephone Encounter (Signed)
If the form is only requesting a cholesterol level; that lab can be scheduled fasting. If this form requires a statement as to his physical health; it would require an office visit, otherwise it is considered a  Fraudulent statement by his insurance co

## 2012-03-14 NOTE — Telephone Encounter (Signed)
pt called has a health screening form that needs cholesterol on it, wouldli ke to do lab only for cholesterol needs this wk if possible cb# [phone] NOTE pt is going out of town to [place] on 11/20 can not come in for OV, needs this form completed by 11.29.13, pt stated he could schedule appt for later date but really needs this for his form Cb# [phone]

## 2012-03-14 NOTE — Telephone Encounter (Signed)
Spoke with patient, patient scheduled appointment for Friday for labs. I will check patient's B/P and pulse when he comes in for labs

## 2012-03-17 ENCOUNTER — Other Ambulatory Visit (INDEPENDENT_AMBULATORY_CARE_PROVIDER_SITE_OTHER): Payer: 59

## 2012-03-17 DIAGNOSIS — Z0000 Encounter for general adult medical examination without abnormal findings: Secondary | ICD-10-CM

## 2012-03-17 LAB — LIPID PANEL
Total CHOL/HDL Ratio: 6
VLDL: 42 mg/dL — ABNORMAL HIGH (ref 0.0–40.0)

## 2012-03-17 LAB — LDL CHOLESTEROL, DIRECT: Direct LDL: 158.2 mg/dL

## 2012-03-17 LAB — GLUCOSE, RANDOM: Glucose, Bld: 100 mg/dL — ABNORMAL HIGH (ref 70–99)

## 2012-03-23 ENCOUNTER — Telehealth: Payer: Self-pay | Admitting: *Deleted

## 2012-03-23 NOTE — Telephone Encounter (Signed)
Pt left VM wanting to know if we have faxed his wellness form. Pt indicated that this due tomorrow. Called Pt back advise that form has been scan to chart with faxed dated of 03-20-12 and I will mail copy to him as well.

## 2012-03-29 ENCOUNTER — Encounter: Payer: Self-pay | Admitting: Internal Medicine

## 2012-03-29 ENCOUNTER — Ambulatory Visit (INDEPENDENT_AMBULATORY_CARE_PROVIDER_SITE_OTHER): Payer: 59 | Admitting: Internal Medicine

## 2012-03-29 VITALS — BP 140/88 | HR 95 | Wt 239.8 lb

## 2012-03-29 DIAGNOSIS — E785 Hyperlipidemia, unspecified: Secondary | ICD-10-CM

## 2012-03-29 DIAGNOSIS — F411 Generalized anxiety disorder: Secondary | ICD-10-CM

## 2012-03-29 NOTE — Progress Notes (Signed)
  Subjective:    Patient ID: Trevor Hurst, male    DOB: Mar 24, 1961, 51 y.o.   MRN: [id number]  HPI He is here to discuss his abnormal lipids. On 11/15 triglycerides were 210, HDL 36.8, and LDL 158.2. He exercises as walking 3.5 miles 3 times per week without symptoms.  He is on no specific diet.  His mother had TIAs beginning in her 18s; there is no family history of coronary artery disease/MI.    Review of Systems He denies chest pain, palpitations, dyspnea, edema, or claudication.     Objective:   Physical Exam He appears healthy and well-nourished; he is in no acute distress  No carotid bruits are present.  Heart rhythm and rate are normal with no significant murmurs or gallops.S4  Chest is clear with no increased work of breathing  There is no evidence of aortic aneurysm or renal artery bruits  He has no clubbing or edema.   Pedal pulses are intact   No ischemic skin changes are present         Assessment & Plan:

## 2012-03-29 NOTE — Assessment & Plan Note (Signed)
He will continue his excellent exercise program. Nutritional interventions will be discussed. Advanced cholesterol testing will be rechecked after 4 months of these lifestyle changes. He'll be asked to take a baby coated aspirin daily

## 2012-03-29 NOTE — Patient Instructions (Addendum)
The most common cause of elevated triglycerides is the ingestion of sugar from high fructose corn syrup sources added to processed foods & drinks.  Eat a low-fat diet with lots of fruits and vegetables, up to 7-9 servings per day. Consume less than 40 (preferably ZERO) grams of sugar per day from foods & drinks with High Fructose Corn Syrup (HFCS) sugar as #1,2,3 or # 4 on label.Whole Foods, [employer] & [employer] do not carry products with HFCS.  Risk of premature heart attack or stroke increases as LDL or BAD cholesterol rises.Advanced cholesterol panel will optimally determine risk based on particle composition ( NMR Lipoprofile ) .The best dietary  information on cholesterol is Dr Gildardo Griffes book Eat, Drink & Be Healthy. Please take enteric-coated aspirin 81 mg daily with breakfast.   PLEASE BRING THESE INSTRUCTIONS TO FOLLOW UP  LAB APPOINTMENT.This will guarantee correct labs are drawn, eliminating need for repeat blood sampling ( needle sticks ! ). Diagnoses /Codes:NMR Lipoprofile /272.4 .   If you activate My Chart; the results can be released to you as soon as they populate from the lab. If you choose not to use this program; the labs have to be reviewed, copied & mailed   causing a delay in getting the results to you.

## 2012-04-28 ENCOUNTER — Telehealth: Payer: Self-pay | Admitting: Internal Medicine

## 2012-04-28 MED ORDER — LORAZEPAM 0.5 MG PO TABS: 0.5000 mg | ORAL_TABLET | Freq: Every day | ORAL | Status: DC

## 2012-04-28 NOTE — Telephone Encounter (Signed)
Refill done.  

## 2012-04-28 NOTE — Telephone Encounter (Signed)
Refill: Lorazepam 0.5 mg tab. Last fill 04-02-12

## 2012-04-28 NOTE — Telephone Encounter (Signed)
Ok #30, no RF 

## 2012-04-28 NOTE — Telephone Encounter (Signed)
Ok to refill? Last OV 11.27.13.

## 2012-05-29 ENCOUNTER — Other Ambulatory Visit: Payer: Self-pay | Admitting: Internal Medicine

## 2012-05-29 MED ORDER — LORAZEPAM 0.5 MG PO TABS: 0.5000 mg | ORAL_TABLET | Freq: Every day | ORAL | Status: DC

## 2012-05-29 NOTE — Telephone Encounter (Signed)
Patient aware Controlled Substance Contract to be sign and rx to be picked up  Patient would like to know if MD would be willing to give additional refills, patient takes nightly prior to bedtime and has noticed a significant difference in sleep since taken medication

## 2012-05-29 NOTE — Telephone Encounter (Signed)
Left message on VM informing patient to return call when available, reason for call (not left on VM):  Patient will need to stop by the office to sign a contract and pick up rx

## 2012-05-29 NOTE — Telephone Encounter (Signed)
RX and contract placed at the front

## 2012-05-29 NOTE — Telephone Encounter (Signed)
refill  LORazepam (Tab) 0.5 MG Take 1 tablet (0.5 mg total) by mouth at bedtime. no qty listed last fill 12.31.13

## 2012-05-29 NOTE — Telephone Encounter (Signed)
RX 2

## 2012-06-28 ENCOUNTER — Encounter: Payer: Self-pay | Admitting: Internal Medicine

## 2012-08-30 ENCOUNTER — Telehealth: Payer: Self-pay | Admitting: General Practice

## 2012-08-30 MED ORDER — LORAZEPAM 0.5 MG PO TABS: 0.5000 mg | ORAL_TABLET | Freq: Every day | ORAL | Status: DC

## 2012-08-30 NOTE — Telephone Encounter (Signed)
OK #30, R X2 

## 2012-08-30 NOTE — Telephone Encounter (Signed)
Ativan refill request with [facility]. Pt last seen on 03/29/12. Med last filled on 05/29/12 #30 with 2 refills. Please advise.

## 2012-08-30 NOTE — Telephone Encounter (Signed)
RX called in .

## 2012-09-10 ENCOUNTER — Emergency Department (INDEPENDENT_AMBULATORY_CARE_PROVIDER_SITE_OTHER)
Admission: EM | Admit: 2012-09-10 | Discharge: 2012-09-10 | Disposition: A | Discharge status: Home / Self Care | Payer: 59 | Source: Home / Self Care | Attending: Family Medicine | Admitting: Family Medicine

## 2012-09-10 ENCOUNTER — Emergency Department (INDEPENDENT_AMBULATORY_CARE_PROVIDER_SITE_OTHER): Payer: 59

## 2012-09-10 ENCOUNTER — Encounter: Payer: Self-pay | Admitting: Emergency Medicine

## 2012-09-10 DIAGNOSIS — M25519 Pain in unspecified shoulder: Secondary | ICD-10-CM

## 2012-09-10 DIAGNOSIS — M7521 Bicipital tendinitis, right shoulder: Secondary | ICD-10-CM

## 2012-09-10 DIAGNOSIS — M7520 Bicipital tendinitis, unspecified shoulder: Secondary | ICD-10-CM

## 2012-09-10 MED ORDER — MELOXICAM 15 MG PO TABS: 15.0000 mg | ORAL_TABLET | Freq: Every day | ORAL | Status: DC

## 2012-09-10 MED ORDER — CYCLOBENZAPRINE HCL 10 MG PO TABS: ORAL_TABLET | ORAL | Status: DC

## 2012-09-10 NOTE — ED Notes (Signed)
Reports waking up 5 days ago with limited ROM right shoulder due to pain. No known injury or action.

## 2012-09-10 NOTE — ED Provider Notes (Signed)
History     CSN: [id number]  Arrival date & time 09/10/12  1137   First MD Initiated Contact with Patient 09/10/12 1233      Chief Complaint  Patient presents with  . Shoulder Pain      HPI Comments: Patient woke up 5 days ago with pain in his right shoulder and limited range of motion that has persisted.  He recalls no trauma or recent change in activities.  Patient is a 52 y.o. male presenting with shoulder pain. The history is provided by the patient.  Shoulder Pain This is a new problem. Episode onset: 5 days ago. The problem occurs constantly. The problem has not changed since onset.Pertinent negatives include no chest pain and no shortness of breath. Exacerbated by: Movement of right shoulder. Nothing relieves the symptoms. Treatments tried: Ibuprofen. The treatment provided mild relief.    Past Medical History  Diagnosis Date  . Anxiety   . Hyperlipidemia     Past Surgical History  Procedure Laterality Date  . Hernia repair  12 yrs ago    R   . Warts  12/23/11    dermatologist froze them on back calfs bilaterally  . Umbilical hernia repair  12/24/2011    Procedure: HERNIA REPAIR UMBILICAL ADULT;  Surgeon: Atilano Ina, MD,FACS;  Location: [facility];  Service: General;  Laterality: N/A;  . Inguinal hernia repair  12/24/2011    Procedure: LAPAROSCOPIC BILATERAL INGUINAL HERNIA REPAIR;  Surgeon: Atilano Ina, MD,FACS;  Location: [facility];  Service: General;;  DR Andrey Campanile SPOKE WITH PT WIFE ABOUT REPAIRING THE UMBILICAL AND LEFT INGUINAL HERNIA BOTH WITH MESH .  ALL  RISKS WERE DISCUSSED WITH  HER OVER THE PHONE.  WIFE INSTRUCTED HIM TO PROCEED WITH REPAIRING THE LEFT INGUINAL AND UMBILICAL HERNIA     Family History  Problem Relation Age of Onset  . Depression Sister   . Lung cancer Father   . Transient ischemic attack Mother   . Diabetes Paternal Grandfather   . Heart disease Neg Hx     History  Substance Use Topics  . Smoking status: Former Smoker -- 0.50 packs/day for  10 years    Quit date: 04/30/2011  . Smokeless tobacco: Never Used  . Alcohol Use: No      Review of Systems  Respiratory: Negative for shortness of breath.   Cardiovascular: Negative for chest pain.  All other systems reviewed and are negative.    Allergies  Review of patient's allergies indicates no known allergies.  Home Medications   Current Outpatient Rx  Name  Route  Sig  Dispense  Refill  . cyclobenzaprine (FLEXERIL) 10 MG tablet      Take one tab by mouth at bedtime   15 tablet   0   . LORazepam (ATIVAN) 0.5 MG tablet   Oral   Take 1 tablet (0.5 mg total) by mouth at bedtime.   30 tablet   2   . meloxicam (MOBIC) 15 MG tablet   Oral   Take 1 tablet (15 mg total) by mouth daily. Take with food each morning   15 tablet   0     BP 166/82  Pulse 99  Temp(Src) 98.1 F (36.7 C) (Oral)  Resp 16  Ht 6' (1.829 m)  Wt 237 lb (107.502 kg)  BMI 32.14 kg/m2  SpO2 97%  Physical Exam  Constitutional: He is oriented to person, place, and time. He appears well-developed and well-nourished. No distress.  Patient is obese (  BMI 32.1)  HENT:  Head: Normocephalic.  Eyes: Conjunctivae are normal. Pupils are equal, round, and reactive to light.  Neck: Normal range of motion.  Cardiovascular: Normal heart sounds.   Pulmonary/Chest: Breath sounds normal.  Musculoskeletal:       Right shoulder: He exhibits decreased range of motion, tenderness and pain. He exhibits no bony tenderness, no swelling, no effusion, no crepitus, no deformity, no spasm, normal pulse and normal strength.  Right shoulder reveals distinct tenderness over insertion of long head of biceps tendon.  Right shoulder range of motion is somewhat limited.  Lymphadenopathy:    He has no cervical adenopathy.  Neurological: He is alert and oriented to person, place, and time.  Skin: Skin is warm and dry. No rash noted.    ED Course  Procedures  none   Dg Shoulder Right  09/10/2012  *RADIOLOGY REPORT*  Clinical Data: Right shoulder pain.  No known injury.  RIGHT SHOULDER - 2+ VIEW  Comparison: None  Findings: There is no evidence of fracture, subluxation or dislocation. The joint space is unremarkable. No focal bony lesions are noted. The visualized right bony thorax is unremarkable.  IMPRESSION: Unremarkable exam.   Original Report Authenticated By: Harmon Pier, M.D.      1. Biceps tendonitis,  right       MDM  Rx given for Mobic and Flexeril.  Sling applied. Apply ice pack for 15 to 20 minutes, 3 or 4 times daily.  Wear sling for about 5 days until improved, then begin range of motion and stretching exercises.  May begin pendulum exercises now. Followup with [facility] if not improving in two weeks.         Lattie Haw, MD 09/14/12 [phone]

## 2012-12-05 ENCOUNTER — Telehealth: Payer: Self-pay | Admitting: *Deleted

## 2012-12-05 MED ORDER — LORAZEPAM 0.5 MG PO TABS: 0.5000 mg | ORAL_TABLET | Freq: Every day | ORAL | Status: DC

## 2012-12-05 NOTE — Telephone Encounter (Signed)
OK X1;R X 1 

## 2012-12-05 NOTE — Telephone Encounter (Signed)
RX called in .

## 2012-12-05 NOTE — Telephone Encounter (Signed)
Pharmacy's requesting a refill for lorazepam 0.5 mg.  Last ov 03/29/12 last date filled 08/30/12 #30 2 R last UDS 05/30/12 low risk. Please Advise.

## 2012-12-05 NOTE — Telephone Encounter (Signed)
Chrae can you print this Rx. Thanks    KP//CMA

## 2012-12-05 NOTE — Telephone Encounter (Signed)
Rx refilled for Lorazepam 0.5 mg 12/05/12.

## 2013-01-15 ENCOUNTER — Telehealth: Payer: Self-pay | Admitting: Internal Medicine

## 2013-01-15 DIAGNOSIS — E785 Hyperlipidemia, unspecified: Secondary | ICD-10-CM

## 2013-01-15 DIAGNOSIS — Z0000 Encounter for general adult medical examination without abnormal findings: Secondary | ICD-10-CM

## 2013-01-15 NOTE — Telephone Encounter (Signed)
Patient states he came in last year and had his labs done and Chrae did his height/weight, then he came in to see Dr. Alwyn Ren a week later. He would like to do this again this year. Is this okay? If so, can you place orders for whatever he had done last November? Send back to me and I can contact patient.

## 2013-01-15 NOTE — Telephone Encounter (Signed)
Orders placed. Pt will need appt for fasting labs.

## 2013-01-17 NOTE — Telephone Encounter (Signed)
Pt scheduled  

## 2013-01-19 ENCOUNTER — Ambulatory Visit: Payer: 59 | Admitting: *Deleted

## 2013-01-19 ENCOUNTER — Other Ambulatory Visit (INDEPENDENT_AMBULATORY_CARE_PROVIDER_SITE_OTHER): Payer: 59

## 2013-01-19 ENCOUNTER — Other Ambulatory Visit: Payer: Self-pay | Admitting: Internal Medicine

## 2013-01-19 DIAGNOSIS — E785 Hyperlipidemia, unspecified: Secondary | ICD-10-CM

## 2013-01-19 DIAGNOSIS — Z0000 Encounter for general adult medical examination without abnormal findings: Secondary | ICD-10-CM

## 2013-01-19 LAB — LIPID PANEL
Cholesterol: 235 mg/dL — ABNORMAL HIGH (ref 0–200)
Total CHOL/HDL Ratio: 6
Triglycerides: 141 mg/dL (ref 0.0–149.0)
VLDL: 28.2 mg/dL (ref 0.0–40.0)

## 2013-01-23 ENCOUNTER — Encounter: Payer: Self-pay | Admitting: *Deleted

## 2013-01-26 ENCOUNTER — Telehealth: Payer: Self-pay | Admitting: Internal Medicine

## 2013-01-26 NOTE — Telephone Encounter (Signed)
Patient states that he dropped off a form on 01/19/13 when he came in to his appointment and we were going to fill it out with his lab results and fax it back to his insurance company. Patient wants to know if this has been completed and would also like a copy faxed to his home address on file for his own records. Please advise.

## 2013-02-09 ENCOUNTER — Other Ambulatory Visit: Payer: Self-pay | Admitting: Internal Medicine

## 2013-02-09 ENCOUNTER — Telehealth: Payer: Self-pay | Admitting: *Deleted

## 2013-02-09 NOTE — Telephone Encounter (Signed)
Ativan refilled.

## 2013-02-09 NOTE — Telephone Encounter (Signed)
Ativan Last OV: 03/29/2012 Last refill: 12/05/2012 #30 1 refill UDS due    Low risk

## 2013-02-09 NOTE — Telephone Encounter (Signed)
Called patient to inform him that his Ativan prescription was ready for pick up. He stated that when he did his last UDS, he was charged nearly $1800 for it. Please advise.

## 2013-02-09 NOTE — Telephone Encounter (Signed)
LM @ (1:30pm) asking the pt to RTC regarding form.//AB/CMA

## 2013-02-09 NOTE — Telephone Encounter (Signed)
#  30, R X 1 

## 2013-02-13 NOTE — Telephone Encounter (Signed)
LM @ (11:26am) informing the pt that he can give [place] a call back to let us know if the ins. Company did not receive the form.//AB/CMA

## 2013-02-14 NOTE — Telephone Encounter (Signed)
Spoke with Victorino Dike at [employer], they spoke with the patient yesterday 02/13/13 and it appears that his insurance company reviewed the claim again and paid it in full.

## 2013-02-15 ENCOUNTER — Telehealth: Payer: Self-pay | Admitting: *Deleted

## 2013-02-15 NOTE — Telephone Encounter (Signed)
Spoke with patient and asked if he would like to pick up his prescription or Ativan and he stated he would like to change to something that is not a controlled substance. He stated he would take to Dr. Alwyn Ren about this issue next week at his appt and instructed that the Ativan prescription be shreaded.

## 2013-02-16 ENCOUNTER — Ambulatory Visit: Payer: 59 | Admitting: Internal Medicine

## 2013-02-22 ENCOUNTER — Encounter: Payer: Self-pay | Admitting: Internal Medicine

## 2013-02-22 ENCOUNTER — Ambulatory Visit (INDEPENDENT_AMBULATORY_CARE_PROVIDER_SITE_OTHER): Payer: 59 | Admitting: Internal Medicine

## 2013-02-22 VITALS — BP 126/72 | HR 90 | Temp 98.1°F | Wt 235.0 lb

## 2013-02-22 DIAGNOSIS — F411 Generalized anxiety disorder: Secondary | ICD-10-CM

## 2013-02-22 DIAGNOSIS — E785 Hyperlipidemia, unspecified: Secondary | ICD-10-CM

## 2013-02-22 NOTE — Progress Notes (Signed)
  Subjective:    Patient ID: Trevor Hurst, male    DOB: 10-Oct-1960, 52 y.o.   MRN: [id number]  HPI  He is here to followup on his lipids. With dietary change his triglycerides have dropped from 210 to 141. As expected the HDL has risen. His LDL is 169.3.  The physiology of LDL risk based on particle number was discussed.  He is a nonsmoker. He chews 4-5 pieces of nicotine gum daily to control anxiety.    Review of Systems He is on a heart healthy & HFCS restricted  diet; he exercises 90 minutes 3 times per week as elliptical & weights  without symptoms. Specifically he denies chest pain, palpitations, dyspnea, or claudication.  Family history is negative for premature coronary disease. Advanced cholesterol testing to be scheduled.      Objective:   Physical Exam Appears healthy and well-nourished & in no acute distress  No carotid bruits are present.No neck pain distention present at 10 - 15 degrees. Thyroid normal to palpation  Heart rhythm and rate are normal with no significant murmurs or gallops. S4  Chest is clear with no increased work of breathing  There is no evidence of aortic aneurysm or renal artery bruits  Abdomen soft with no organomegaly.Mesh palpable in mid line. No HJR  No clubbing, cyanosis or edema present.  Pedal pulses are intact   No ischemic skin changes are present . Nails healthy   Alert and oriented. Calm demeanor; communicative.Strength, tone, DTRs reflexes normal          Assessment & Plan:  See Current Assessment & Plan in Problem List under specific Diagnosis

## 2013-02-22 NOTE — Assessment & Plan Note (Signed)
NMR Lipoprofile  

## 2013-02-22 NOTE — Patient Instructions (Signed)
He has a chronic sleep disorder which has been aggravated by discontinuation of smoking. This is associated with anxiety; employing nicotine gum 4-5 pieces day controls this issue completely.

## 2013-02-22 NOTE — Assessment & Plan Note (Addendum)
The pathophysiology of  Dopamine neurotransmitter deficiency was discussed along with the benefits and potential adverse effects of present therapy.  He uses the nicotine for anxiety control with excellent response. He does not ingest more than 5 pieces per day.

## 2013-02-26 ENCOUNTER — Other Ambulatory Visit: Payer: Self-pay | Admitting: Internal Medicine

## 2013-02-26 ENCOUNTER — Other Ambulatory Visit (INDEPENDENT_AMBULATORY_CARE_PROVIDER_SITE_OTHER): Payer: 59

## 2013-02-26 DIAGNOSIS — E785 Hyperlipidemia, unspecified: Secondary | ICD-10-CM

## 2013-02-27 LAB — NMR LIPOPROFILE WITH LIPIDS
Cholesterol, Total: 212 mg/dL — ABNORMAL HIGH (ref <200)
HDL Particle Number: 27.7 umol/L — ABNORMAL LOW (ref >=30.5)
LP-IR Score: 65 — ABNORMAL HIGH (ref <=45)
Large VLDL-P: 1.5 nmol/L (ref <=2.7)
Small LDL Particle Number: 1331 nmol/L — ABNORMAL HIGH (ref <=527)
Triglycerides: 108 mg/dL (ref <150)
VLDL Size: 49.7 nm — ABNORMAL HIGH (ref <=46.6)

## 2013-03-08 ENCOUNTER — Other Ambulatory Visit: Payer: Self-pay

## 2013-06-15 ENCOUNTER — Other Ambulatory Visit: Payer: Self-pay | Admitting: Internal Medicine

## 2013-06-18 NOTE — Telephone Encounter (Signed)
Requesting Lorazepam 0.5mg  Take 1 tablet by mouth nightly at bedtime as needed. Last refill:02-09-13:#30,1 Last OV:02-22-13 UDS:05-30-12-Low risk Please advise://AB/CMA

## 2013-06-18 NOTE — Telephone Encounter (Signed)
OK X1 

## 2013-06-20 NOTE — Telephone Encounter (Signed)
This medication was called in today.

## 2013-07-02 ENCOUNTER — Telehealth: Payer: Self-pay

## 2013-07-02 NOTE — Telephone Encounter (Signed)
Left message for call back Non identifiable  

## 2013-07-03 ENCOUNTER — Ambulatory Visit (INDEPENDENT_AMBULATORY_CARE_PROVIDER_SITE_OTHER): Payer: 59 | Admitting: Internal Medicine

## 2013-07-03 ENCOUNTER — Encounter: Payer: Self-pay | Admitting: Internal Medicine

## 2013-07-03 VITALS — BP 150/90 | HR 90 | Temp 97.8°F | Ht 71.0 in | Wt 236.0 lb

## 2013-07-03 DIAGNOSIS — E785 Hyperlipidemia, unspecified: Secondary | ICD-10-CM

## 2013-07-03 DIAGNOSIS — F411 Generalized anxiety disorder: Secondary | ICD-10-CM

## 2013-07-03 NOTE — Addendum Note (Signed)
Addended by: Kathlene November E on: 07/03/2013 05:09 PM   Modules accepted: Level of Service

## 2013-07-03 NOTE — Assessment & Plan Note (Signed)
Moderately elevated LDL, has improved his habits in the last few months and plans to improve even more. Diet discussed--- Mediterranean diet? Plan: Return to the office in 4 months for a physical, fasting, recheck cholesterol then

## 2013-07-03 NOTE — Progress Notes (Signed)
Subjective:    Patient ID: Trevor Hurst, male    DOB: 1961-02-26, 53 y.o.   MRN: [id number]  DOS:  07/03/2013 Type of  visit: ROV, to get established , transferring from Dr Linna Darner  In general doing well BP slt elevated today, amb BPS reportedly very good   PMH-PSH-FH-SH and labs reviewed   ROS Diet,Exercise--Much improved in the last few months, going to the gym regularly. No  CP, SOB  Denies  nausea, vomiting diarrhea No abdominal pain Denies  blood in the stools + stress but not anxiety, depression   Past Medical History  Diagnosis Date  . Anxiety   . Hyperlipidemia     Past Surgical History  Procedure Laterality Date  . Hernia repair  12 yrs ago    R   . Warts  12/23/11    dermatologist froze them on back calfs bilaterally  . Umbilical hernia repair  12/24/2011    Procedure: HERNIA REPAIR UMBILICAL ADULT;  Surgeon: Gayland Curry, MD,FACS;  Location: [facility];  Service: General;  Laterality: N/A;  . Inguinal hernia repair  12/24/2011    Procedure: LAPAROSCOPIC BILATERAL INGUINAL HERNIA REPAIR;  Surgeon: Gayland Curry, MD,FACS;  Location: [facility];  Service: General;;  DR Redmond Pulling SPOKE WITH PT WIFE ABOUT REPAIRING THE UMBILICAL AND LEFT INGUINAL HERNIA BOTH WITH MESH .  ALL  RISKS WERE DISCUSSED WITH  HER OVER THE PHONE.  WIFE INSTRUCTED HIM TO PROCEED WITH REPAIRING THE LEFT INGUINAL AND UMBILICAL HERNIA     History   Social History  . Marital Status: Married    Spouse Name: N/A    Number of Children: 2  . Years of Education: N/A   Occupational History  . IT    Social History Main Topics  . Smoking status: Former Smoker -- 0.50 packs/day for 10 years    Quit date: 04/30/2011  . Smokeless tobacco: Never Used     Comment: smoked 1979-2012 , up to 1 ppd. Off for total of 4 years.Smoked total 29 years.  . Alcohol Use: Yes     Comment: socially   . Drug Use: No  . Sexual Activity: Not on file   Other Topics Concern  . Not on file   Social History Narrative  . No  narrative on file     Family History  Problem Relation Age of Onset  . Depression Sister   . Lung cancer Father     was a smoker  . Transient ischemic attack Mother   . Diabetes Paternal Grandfather   . Heart disease Neg Hx   . Colon cancer Neg Hx   . Prostate cancer Neg Hx        Medication List       This list is accurate as of: 07/03/13  5:07 PM.  Always use your most recent med list.               LORazepam 0.5 MG tablet  Commonly known as:  ATIVAN  TAKE ONE TABLET BY MOUTH NIGHTLY AT BEDTIME AS NEEDED           Objective:   Physical Exam BP 150/90  Pulse 90  Temp(Src) 97.8 F (36.6 C)  Ht 5\' 11"  (1.803 m)  Wt 236 lb (107.049 kg)  BMI 32.93 kg/m2  SpO2 100% General -- alert, well-developed, NAD.  Lungs -- normal respiratory effort, no intercostal retractions, no accessory muscle use, and normal breath sounds.  Heart-- normal rate, regular rhythm, no  murmur.   Extremities-- no pretibial edema bilaterally  Neurologic--  alert & oriented X3. Speech normal, gait normal, strength normal in all extremities.  Psych-- Cognition and judgment appear intact. Cooperative with normal attention span and concentration. No anxious or depressed appearing.        Assessment & Plan:   Elevated BP, BP elevated today, BP was rechecked and we obtained the same value. At home, readings are normal. Plan--Recheck on return to the office

## 2013-07-03 NOTE — Patient Instructions (Addendum)
Go to the lab  before you leave for a UDS   Next visit is for a physical exam in 4 months  , fasting Please make an appointment    Check the  blood pressure 2 or 3 times a month  be sure it is between 110/60 and 140/85. Ideal blood pressure is 120/80. If it is consistently higher or lower, let me know

## 2013-07-03 NOTE — Telephone Encounter (Signed)
Unable to reach prior to visit  

## 2013-07-03 NOTE — Progress Notes (Signed)
Pre visit review using our clinic review tool, if applicable. No additional management support is needed unless otherwise documented below in the visit note. 

## 2013-07-03 NOTE — Assessment & Plan Note (Signed)
Insomnia well-controlled with Ativan as needed, UDS last year negative Plan will obtain a UDS today, call for refills as needed

## 2013-07-17 ENCOUNTER — Telehealth: Payer: Self-pay

## 2013-07-17 NOTE — Telephone Encounter (Signed)
UDS: 07/04/2013 Positive for Lorazepam Low Risk per Dr Larose Kells

## 2013-08-01 ENCOUNTER — Encounter: Payer: Self-pay | Admitting: Internal Medicine

## 2013-09-16 ENCOUNTER — Other Ambulatory Visit: Payer: Self-pay | Admitting: Internal Medicine

## 2013-09-18 ENCOUNTER — Telehealth: Payer: Self-pay | Admitting: Internal Medicine

## 2013-09-18 DIAGNOSIS — R110 Nausea: Secondary | ICD-10-CM

## 2013-09-18 MED ORDER — ONDANSETRON HCL 4 MG PO TABS: 4.0000 mg | ORAL_TABLET | Freq: Three times a day (TID) | ORAL | Status: DC | PRN

## 2013-09-18 MED ORDER — LORAZEPAM 0.5 MG PO TABS: ORAL_TABLET | ORAL | Status: DC

## 2013-09-18 NOTE — Telephone Encounter (Signed)
Rx for Ativan faxed to [facility] and Rx for Zofran #20 also sent to [facility]. Patient aware

## 2013-09-18 NOTE — Telephone Encounter (Signed)
rx printed

## 2013-09-18 NOTE — Telephone Encounter (Signed)
Caller name:Trevor Hurst Relation to pt: patient Call back number:[phone] Pharmacy:[id number] - [place], [place] [address]   Reason for call: to request a refill for LORazepam (ATIVAN) 0.5 MG tablet. Patient also requested 5 pills of Phenergan because he is traveling to [place] in the next couple of days. Please advise.

## 2013-09-18 NOTE — Telephone Encounter (Signed)
Patient is requesting a refill for Ativan Last OV 07/03/13 Last filled 06/18/13 #90 with 0 RF UDS 07/17/13 Low Risk Okay to refill?

## 2013-09-20 ENCOUNTER — Telehealth: Payer: Self-pay | Admitting: Internal Medicine

## 2013-09-20 NOTE — Telephone Encounter (Signed)
Rx for Ativan called to [facility].

## 2013-09-20 NOTE — Telephone Encounter (Signed)
Patient called stating [facility] still does not have rx for lorazepam. Please re-send and patient is requesting a call when this is completed. CB# [phone]

## 2013-10-03 ENCOUNTER — Telehealth: Payer: Self-pay | Admitting: *Deleted

## 2013-10-03 DIAGNOSIS — R110 Nausea: Secondary | ICD-10-CM

## 2013-10-03 NOTE — Telephone Encounter (Signed)
Prior authorization form faxed for ondansetron hcl 4mg  tabs. Awaiting response. JG//CMA

## 2013-10-04 NOTE — Telephone Encounter (Signed)
Additional information faxed. Awaiting response. JG//CMA

## 2013-10-05 MED ORDER — ONDANSETRON HCL 4 MG PO TABS: 4.0000 mg | ORAL_TABLET | Freq: Three times a day (TID) | ORAL | Status: DC | PRN

## 2013-10-05 NOTE — Telephone Encounter (Signed)
Received approval letter for ondansetron HCL. Insurance will only cover 18 pills of 4mg . New prescription sent to pharmacy. JG//CMA

## 2013-11-07 ENCOUNTER — Encounter: Payer: Self-pay | Admitting: Internal Medicine

## 2013-11-07 ENCOUNTER — Ambulatory Visit (INDEPENDENT_AMBULATORY_CARE_PROVIDER_SITE_OTHER): Payer: 59 | Admitting: Internal Medicine

## 2013-11-07 VITALS — BP 137/85 | HR 86 | Temp 98.2°F | Ht 71.3 in | Wt 220.0 lb

## 2013-11-07 DIAGNOSIS — F411 Generalized anxiety disorder: Secondary | ICD-10-CM

## 2013-11-07 DIAGNOSIS — Z0000 Encounter for general adult medical examination without abnormal findings: Secondary | ICD-10-CM | POA: Insufficient documentation

## 2013-11-07 LAB — CBC WITH DIFFERENTIAL/PLATELET
Basophils Absolute: 0 10*3/uL (ref 0.0–0.1)
Basophils Relative: 0.5 % (ref 0.0–3.0)
EOS ABS: 0.1 10*3/uL (ref 0.0–0.7)
Eosinophils Relative: 1.2 % (ref 0.0–5.0)
HCT: 45.4 % (ref 39.0–52.0)
Hemoglobin: 15.3 g/dL (ref 13.0–17.0)
LYMPHS PCT: 21.9 % (ref 12.0–46.0)
Lymphs Abs: 1.4 10*3/uL (ref 0.7–4.0)
MCHC: 33.6 g/dL (ref 30.0–36.0)
MCV: 88.6 fl (ref 78.0–100.0)
MONOS PCT: 8.1 % (ref 3.0–12.0)
Monocytes Absolute: 0.5 10*3/uL (ref 0.1–1.0)
Neutro Abs: 4.5 10*3/uL (ref 1.4–7.7)
Neutrophils Relative %: 68.3 % (ref 43.0–77.0)
Platelets: 161 10*3/uL (ref 150.0–400.0)
RBC: 5.12 Mil/uL (ref 4.22–5.81)
RDW: 13.4 % (ref 11.5–15.5)
WBC: 6.6 10*3/uL (ref 4.0–10.5)

## 2013-11-07 LAB — COMPREHENSIVE METABOLIC PANEL
ALT: 37 U/L (ref 0–53)
AST: 36 U/L (ref 0–37)
Albumin: 4.1 g/dL (ref 3.5–5.2)
Alkaline Phosphatase: 84 U/L (ref 39–117)
BUN: 12 mg/dL (ref 6–23)
CALCIUM: 9.1 mg/dL (ref 8.4–10.5)
CHLORIDE: 101 meq/L (ref 96–112)
CO2: 26 meq/L (ref 19–32)
Creatinine, Ser: 0.8 mg/dL (ref 0.4–1.5)
GFR: 106.42 mL/min (ref >60.00)
Glucose, Bld: 91 mg/dL (ref 70–99)
Potassium: 3.9 mEq/L (ref 3.5–5.1)
SODIUM: 137 meq/L (ref 135–145)
TOTAL PROTEIN: 6.8 g/dL (ref 6.0–8.3)
Total Bilirubin: 0.8 mg/dL (ref 0.2–1.2)

## 2013-11-07 LAB — LIPID PANEL
Cholesterol: 205 mg/dL — ABNORMAL HIGH (ref 0–200)
HDL: 37.7 mg/dL — AB (ref >39.00)
LDL Cholesterol: 142 mg/dL — ABNORMAL HIGH (ref 0–99)
NONHDL: 167.3
Total CHOL/HDL Ratio: 5
Triglycerides: 127 mg/dL (ref 0.0–149.0)
VLDL: 25.4 mg/dL (ref 0.0–40.0)

## 2013-11-07 LAB — TSH: TSH: 0.38 u[IU]/mL (ref 0.35–4.50)

## 2013-11-07 LAB — PSA: PSA: 2.36 ng/mL (ref 0.10–4.00)

## 2013-11-07 NOTE — Progress Notes (Signed)
Subjective:    Patient ID: Trevor Hurst, male    DOB: 1960-11-19, 53 y.o.   MRN: [id number]  DOS:  11/07/2013 Type of visit - description: CPX     ROS Diet-- trying to do better  Exercise-- 4 days /week, lost 16 pounds No  CP, SOB Denies  nausea, vomiting diarrhea, blood in the stools (-) cough, sputum production (-) wheezing, chest congestion  No dysuria, gross hematuria, difficulty urinating   anxiety well controlled, see meds, no depression    Past Medical History  Diagnosis Date  . Anxiety   . Hyperlipidemia     Past Surgical History  Procedure Laterality Date  . Hernia repair  12 yrs ago    R   . Warts  12/23/11    dermatologist froze them on back calfs bilaterally  . Umbilical hernia repair  12/24/2011    Procedure: HERNIA REPAIR UMBILICAL ADULT;  Surgeon: Gayland Curry, MD,FACS;  Location: [facility];  Service: General;  Laterality: N/A;  . Inguinal hernia repair  12/24/2011    Procedure: LAPAROSCOPIC BILATERAL INGUINAL HERNIA REPAIR;  Surgeon: Gayland Curry, MD,FACS;  Location: [facility];  Service: General;;  DR Redmond Pulling SPOKE WITH PT WIFE ABOUT REPAIRING THE UMBILICAL AND LEFT INGUINAL HERNIA BOTH WITH MESH .  ALL  RISKS WERE DISCUSSED WITH  HER OVER THE PHONE.  WIFE INSTRUCTED HIM TO PROCEED WITH REPAIRING THE LEFT INGUINAL AND UMBILICAL HERNIA     History   Social History  . Marital Status: Married    Spouse Name: N/A    Number of Children: 2  . Years of Education: N/A   Occupational History  . IT    Social History Main Topics  . Smoking status: Former Smoker -- 0.50 packs/day for 10 years    Quit date: 04/30/2011  . Smokeless tobacco: Never Used     Comment: smoked 1979-2012 , up to 1 ppd. Off for total of 4 years.Smoked total 29 years.  . Alcohol Use: Yes     Comment: socially   . Drug Use: No  . Sexual Activity: Not on file   Other Topics Concern  . Not on file   Social History Narrative   Lives w/ wife   Family History  Problem Relation Age of  Onset  . Depression Sister   . Lung cancer Father     was a smoker  . Transient ischemic attack Mother   . Diabetes Paternal Grandfather   . Heart disease Neg Hx   . Colon cancer Neg Hx   . Prostate cancer Neg Hx          Medication List       This list is accurate as of: 11/07/13  5:46 PM.  Always use your most recent med list.               LORazepam 0.5 MG tablet  Commonly known as:  ATIVAN  TAKE ONE TABLET BY MOUTH NIGHTLY AT BEDTIME AS NEEDED           Objective:   Physical Exam BP 137/85  Pulse 86  Temp(Src) 98.2 F (36.8 C)  Ht 5' 11.3" (1.811 m)  Wt 220 lb (99.791 kg)  BMI 30.43 kg/m2  SpO2 98%  General -- alert, well-developed, NAD.  Neck --no thyromegaly   HEENT-- Not pale.   Lungs -- normal respiratory effort, no intercostal retractions, no accessory muscle use, and normal breath sounds.  Heart-- normal rate, regular rhythm, no murmur.  Abdomen-- Not distended, good bowel sounds,soft, non-tender. Rectal-- No external abnormalities noted. Normal sphincter tone. No rectal masses or tenderness. Stool brown, Hemoccult negative  Prostate--Prostate gland firm and smooth, no enlargement, nodularity, tenderness, mass, asymmetry or induration. Extremities-- no pretibial edema bilaterally  Neurologic--  alert & oriented X3. Speech normal, gait appropriate for age, strength symmetric and appropriate for age.  Psych-- Cognition and judgment appear intact. Cooperative with normal attention span and concentration. No anxious or depressed appearing.     Assessment & Plan:

## 2013-11-07 NOTE — Assessment & Plan Note (Addendum)
Symptoms well-controlled w/ current meds , UDS  Negative  07-2013 Refill meds when necessary

## 2013-11-07 NOTE — Patient Instructions (Signed)
Get your blood work before you leave   Call for refills when needed   Next visit is for a physical exam in 1 year,  fasting Please make an appointment     At some point this year the clinic will relocate to  [place] and [address] (10 minutes form here)  [address]  [place], [place] [address] [phone]

## 2013-11-07 NOTE — Assessment & Plan Note (Addendum)
Td~ 5 years Never had a cscope , desires CCS, will refer to GI (request Dr Johna Roles) Doing great w/ life style, lost ~15 pounds Labs  RTC 1 year

## 2013-11-07 NOTE — Progress Notes (Signed)
Pre visit review using our clinic review tool, if applicable. No additional management support is needed unless otherwise documented below in the visit note. 

## 2014-02-08 ENCOUNTER — Telehealth: Payer: Self-pay | Admitting: Internal Medicine

## 2014-02-08 NOTE — Telephone Encounter (Signed)
Caller name: Elex  Call back number:[phone]   Reason for call:  Pt dropped of clinical data sheet (weight, bp, ect) last week; has this been completed or have you seen it?

## 2014-02-11 NOTE — Telephone Encounter (Signed)
I have not seen form. I have asked Dr. Larose Kells and [place] and neither have seen form. Patient will need to bring a new form or have insurance fax one to [place]. I called and left message for patient to return call. JG//CMA

## 2014-03-18 ENCOUNTER — Telehealth: Payer: Self-pay

## 2014-03-18 MED ORDER — LORAZEPAM 0.5 MG PO TABS
ORAL_TABLET | ORAL | Status: DC
Start: 2014-03-18 — End: 2014-09-22

## 2014-03-18 NOTE — Telephone Encounter (Signed)
Faxed to [facility].  

## 2014-03-18 NOTE — Telephone Encounter (Signed)
Pt is requesting refill on Lorazepam.   Last OV: 11/07/2013 Last Fill: 09/18/2013 # 57 1RF UDS: 07/17/2013 Low risk  Please advise.

## 2014-03-18 NOTE — Telephone Encounter (Signed)
done

## 2014-03-25 ENCOUNTER — Telehealth: Payer: Self-pay

## 2014-03-25 NOTE — Telephone Encounter (Signed)
Please advise 

## 2014-03-25 NOTE — Telephone Encounter (Signed)
Trevor Hurst [phone] [facility] called to see if he could get refills on his LORazepam (ATIVAN) 0.5 MG tablet, to last him till the end of the year, and then after insurance changes first of the year get his drug test. He stated that Dr Linna Darner had helped him with this in the past.

## 2014-03-26 NOTE — Telephone Encounter (Signed)
Spoke with Pt, informed him rx was faxed to [facility] on 11/16 # 33 1RF, Pt stated he would check with pharmacy. I informed him to let me know if they do not have it that I can refax. Pt verbalized understanding.

## 2014-03-26 NOTE — Telephone Encounter (Signed)
On 03/18/2014 was prescribed #90 and 1 refill, he takes one tablet at bedtime, he has plenty of meds to finish the year

## 2014-05-20 ENCOUNTER — Telehealth: Payer: Self-pay | Admitting: Internal Medicine

## 2014-05-20 NOTE — Telephone Encounter (Signed)
Pt has not been seen since July 2015, not sure as to what urine results he is requesting. He has not had urine done with [place] since before July 2015.

## 2014-05-20 NOTE — Telephone Encounter (Signed)
.  Caller name:Hurst, Trevor E Relation to pt: self  Call back number:[phone]   Reason for call:  Pt is inquiring about urine results

## 2014-05-22 ENCOUNTER — Telehealth: Payer: Self-pay

## 2014-05-22 NOTE — Telephone Encounter (Signed)
Noted. Awaiting UDS results from Dr. Larose Kells.

## 2014-05-22 NOTE — Telephone Encounter (Signed)
UDS results received via fax from [facility]. Placed in red folder for Dr. Larose Kells. JG//CMA

## 2014-05-22 NOTE — Telephone Encounter (Signed)
UDS: 05/09/2014  Positive for Lorazepam   Low risk per Dr. Larose Kells 05/22/2014

## 2014-05-22 NOTE — Telephone Encounter (Signed)
UDS: 05/09/2014  Positive for Lorazepam    Low risk per Dr. Larose Kells 05/22/2014

## 2014-08-30 ENCOUNTER — Encounter: Payer: Self-pay | Admitting: Internal Medicine

## 2014-09-22 ENCOUNTER — Other Ambulatory Visit: Payer: Self-pay | Admitting: Internal Medicine

## 2014-09-23 NOTE — Telephone Encounter (Signed)
Rx printed, awaiting MD signature.  

## 2014-09-23 NOTE — Telephone Encounter (Signed)
Ok 90 and 1 RF 

## 2014-09-23 NOTE — Telephone Encounter (Signed)
Pt is requesting refill on Lorazepam.  Last OV: 11/07/2013 Last Fill: 03/19/2015 # 24 1RF UDS: 05/09/2014 Low risk  Please advise.

## 2014-09-23 NOTE — Telephone Encounter (Signed)
Rx faxed to [facility].  

## 2014-09-27 DIAGNOSIS — L409 Psoriasis, unspecified: Secondary | ICD-10-CM | POA: Insufficient documentation

## 2014-10-23 ENCOUNTER — Other Ambulatory Visit: Payer: Self-pay | Admitting: Internal Medicine

## 2014-10-24 ENCOUNTER — Ambulatory Visit (AMBULATORY_SURGERY_CENTER): Payer: Self-pay

## 2014-10-24 VITALS — Ht 72.0 in | Wt 222.0 lb

## 2014-10-24 DIAGNOSIS — Z1211 Encounter for screening for malignant neoplasm of colon: Secondary | ICD-10-CM

## 2014-10-24 MED ORDER — NA SULFATE-K SULFATE-MG SULF 17.5-3.13-1.6 GM/177ML PO SOLN: 1.0000 | Freq: Once | ORAL | Status: DC

## 2014-10-24 NOTE — Progress Notes (Signed)
No home 02 No diet drugs History of nausea post procedure No egg or soy allergies

## 2014-11-01 ENCOUNTER — Ambulatory Visit (AMBULATORY_SURGERY_CENTER): Payer: 59 | Admitting: Internal Medicine

## 2014-11-01 ENCOUNTER — Encounter: Payer: Self-pay | Admitting: Internal Medicine

## 2014-11-01 VITALS — BP 118/44 | HR 80 | Temp 96.9°F | Resp 21 | Ht 72.0 in | Wt 222.0 lb

## 2014-11-01 DIAGNOSIS — Z1211 Encounter for screening for malignant neoplasm of colon: Secondary | ICD-10-CM

## 2014-11-01 MED ORDER — SODIUM CHLORIDE 0.9 % IV SOLN: 500.0000 mL | INTRAVENOUS | Status: DC

## 2014-11-01 NOTE — Op Note (Signed)
[place]  [address]. [place], [id number]   COLONOSCOPY PROCEDURE REPORT  PATIENT: Trevor Hurst, Trevor Hurst  MR#: [id number] BIRTHDATE: 1960/06/23 , 4  yrs. old GENDER: male ENDOSCOPIST: Lafayette Dragon, MD REFERRED YV:OPFY Larose Kells, M.D. PROCEDURE DATE:  11/01/2014 PROCEDURE:   Colonoscopy, screening First Screening Colonoscopy - Avg.  risk and is 50 yrs.  old or older Yes.  Prior Negative Screening - Now for repeat screening. N/A  History of Adenoma - Now for follow-up colonoscopy & has been > or = to 3 yrs.  N/A  Polyps removed today? No Recommend repeat exam, <10 yrs? No ASA CLASS:   Class I INDICATIONS:Screening for colonic neoplasia and Colorectal Neoplasm Risk Assessment for this procedure is average risk. MEDICATIONS: Monitored anesthesia care and Propofol 250 mg IV  DESCRIPTION OF PROCEDURE:   After the risks benefits and alternatives of the procedure were thoroughly explained, informed consent was obtained.  The digital rectal exam revealed no abnormalities of the rectum.   The LB [id number]  endoscope was introduced through the anus and advanced to the cecum, which was identified by both the appendix and ileocecal valve. No adverse events experienced.   The quality of the prep was excellent. (MoviPrep was used)  The instrument was then slowly withdrawn as the colon was fully examined. Estimated blood loss is zero unless otherwise noted in this procedure report.      COLON FINDINGS: A normal appearing cecum, ileocecal valve, and appendiceal orifice were identified.  The ascending, transverse, descending, sigmoid colon, and rectum appeared unremarkable. Retroflexed views revealed no abnormalities. The time to cecum = 6.40 Withdrawal time = 6.00   The scope was withdrawn and the procedure completed. COMPLICATIONS: There were no immediate complications.  ENDOSCOPIC IMPRESSION: Normal colonoscopy  RECOMMENDATIONS: High fiber diet Recall colonoscopy  in 10 years  eSigned:  Lafayette Dragon, MD 11/01/2014 8:02 AM   cc:

## 2014-11-01 NOTE — Patient Instructions (Signed)
Impressions/recommendations:  Normal colonoscopy  High fiber diet (handout given) Repeat colonoscopy in 10 years.  YOU HAD AN ENDOSCOPIC PROCEDURE TODAY AT [facility]:   Refer to the procedure report that was given to you for any specific questions about what was found during the examination.  If the procedure report does not answer your questions, please call your gastroenterologist to clarify.  If you requested that your care partner not be given the details of your procedure findings, then the procedure report has been included in a sealed envelope for you to review at your convenience later.  YOU SHOULD EXPECT: Some feelings of bloating in the abdomen. Passage of more gas than usual.  Walking can help get rid of the air that was put into your GI tract during the procedure and reduce the bloating. If you had a lower endoscopy (such as a colonoscopy or flexible sigmoidoscopy) you may notice spotting of blood in your stool or on the toilet paper. If you underwent a bowel prep for your procedure, you may not have a normal bowel movement for a few days.  Please Note:  You might notice some irritation and congestion in your nose or some drainage.  This is from the oxygen used during your procedure.  There is no need for concern and it should clear up in a day or so.  SYMPTOMS TO REPORT IMMEDIATELY:   Following lower endoscopy (colonoscopy or flexible sigmoidoscopy):  Excessive amounts of blood in the stool  Significant tenderness or worsening of abdominal pains  Swelling of the abdomen that is new, acute  Fever of 100F or higher    For urgent or emergent issues, a gastroenterologist can be reached at any hour by calling [phone].   DIET: Your first meal following the procedure should be a small meal and then it is ok to progress to your normal diet. Heavy or fried foods are harder to digest and may make you feel nauseous or bloated.  Likewise, meals heavy in dairy  and vegetables can increase bloating.  Drink plenty of fluids but you should avoid alcoholic beverages for 24 hours.  ACTIVITY:  You should plan to take it easy for the rest of today and you should NOT DRIVE or use heavy machinery until tomorrow (because of the sedation medicines used during the test).    FOLLOW UP:  Our staff will call the number listed on your records the next business day following your procedure to check on you and address any questions or concerns that you may have regarding the information given to you following your procedure. If we do not reach you, we will leave a message.  However, if you are feeling well and you are not experiencing any problems, there is no need to return our call.  We will assume that you have returned to your regular daily activities without incident.  If any biopsies were taken you will be contacted by phone or by letter within the next 1-3 weeks.  Please call us at [phone] if you have not heard about the biopsies in 3 weeks.    SIGNATURES/CONFIDENTIALITY: You and/or your care partner have signed paperwork which will be entered into your electronic medical record.  These signatures attest to the fact that that the information above on your After Visit Summary has been reviewed and is understood.  Full responsibility of the confidentiality of this discharge information lies with you and/or your care-partner.

## 2014-11-05 ENCOUNTER — Telehealth: Payer: Self-pay

## 2014-11-05 NOTE — Telephone Encounter (Signed)
  Follow up Call-  Call back number 11/01/2014  Post procedure Call Back phone  # [phone]  Permission to leave phone message Yes     Patient questions:  Do you have a fever, pain , or abdominal swelling? No. Pain Score  0 *  Have you tolerated food without any problems? Yes.    Have you been able to return to your normal activities? Yes.    Do you have any questions about your discharge instructions: Diet   No. Medications  No. Follow up visit  No.  Do you have questions or concerns about your Care? No.  Actions: * If pain score is 4 or above: No action needed, pain <4.

## 2014-11-24 ENCOUNTER — Other Ambulatory Visit: Payer: Self-pay | Admitting: Internal Medicine

## 2015-01-22 ENCOUNTER — Encounter: Payer: Self-pay | Admitting: Internal Medicine

## 2015-01-22 ENCOUNTER — Ambulatory Visit (INDEPENDENT_AMBULATORY_CARE_PROVIDER_SITE_OTHER): Payer: 59 | Admitting: Internal Medicine

## 2015-01-22 VITALS — BP 118/74 | HR 86 | Temp 97.9°F | Ht 71.5 in | Wt 222.0 lb

## 2015-01-22 DIAGNOSIS — Z09 Encounter for follow-up examination after completed treatment for conditions other than malignant neoplasm: Secondary | ICD-10-CM

## 2015-01-22 DIAGNOSIS — Z23 Encounter for immunization: Secondary | ICD-10-CM

## 2015-01-22 DIAGNOSIS — Z0000 Encounter for general adult medical examination without abnormal findings: Secondary | ICD-10-CM | POA: Diagnosis not present

## 2015-01-22 LAB — BASIC METABOLIC PANEL
BUN: 11 mg/dL (ref 6–23)
CO2: 30 mEq/L (ref 19–32)
Calcium: 9.4 mg/dL (ref 8.4–10.5)
Chloride: 103 mEq/L (ref 96–112)
Creatinine, Ser: 0.83 mg/dL (ref 0.40–1.50)
GFR: 102.98 mL/min (ref >60.00)
GLUCOSE: 94 mg/dL (ref 70–99)
POTASSIUM: 3.7 meq/L (ref 3.5–5.1)
Sodium: 138 mEq/L (ref 135–145)

## 2015-01-22 LAB — LIPID PANEL
Cholesterol: 202 mg/dL — ABNORMAL HIGH (ref 0–200)
HDL: 38.5 mg/dL — ABNORMAL LOW (ref >39.00)
LDL CALC: 138 mg/dL — AB (ref 0–99)
NonHDL: 163.91
Total CHOL/HDL Ratio: 5
Triglycerides: 129 mg/dL (ref 0.0–149.0)
VLDL: 25.8 mg/dL (ref 0.0–40.0)

## 2015-01-22 NOTE — Patient Instructions (Addendum)
Get your blood work before you leave      Next visit  for a physical exam in one year, fasting    Please schedule an appointment at the front desk

## 2015-01-22 NOTE — Progress Notes (Signed)
Pre visit review using our clinic review tool, if applicable. No additional management support is needed unless otherwise documented below in the visit note. 

## 2015-01-22 NOTE — Assessment & Plan Note (Addendum)
Td today Flu shot at work [facility] cscope 11-2014, next per GI Prostate cancer screening, 2015 DRE and PSA were normal Doing very well with diet and exercise Labs : BMP, FLP, HIV, hep C RTC 1 year

## 2015-01-22 NOTE — Progress Notes (Signed)
Subjective:    Patient ID: Trevor Hurst, male    DOB: 10/14/1960, 54 y.o.   MRN: [id number]  DOS:  01/22/2015 Type of visit - description : CPX Interval history:no concerns, feeling well    Review of Systems Constitutional: No fever. No chills. No unexplained wt changes. No unusual sweats  HEENT: No dental problems, no ear discharge, no facial swelling, no voice changes. No eye discharge, no eye  redness , no  intolerance to light   Respiratory: No wheezing , no  difficulty breathing. No cough , no mucus production  Cardiovascular: No CP, no leg swelling , no  Palpitations  GI: no nausea, no vomiting, no diarrhea , no  abdominal pain.  No blood in the stools. No dysphagia, no odynophagia    Endocrine: No polyphagia, no polyuria , no polydipsia  GU: No dysuria, gross hematuria, difficulty urinating. No urinary urgency, no frequency.  Musculoskeletal: No joint swellings or unusual aches or pains  Skin: No change in the color of the skin, palor , no  Rash  Allergic, immunologic: No environmental allergies , no  food allergies  Neurological: No dizziness no  syncope. No headaches. No diplopia, no slurred, no slurred speech, no motor deficits, no facial  Numbness  Hematological: No enlarged lymph nodes, no easy bruising , no unusual bleedings  Psychiatry: No suicidal ideas, no hallucinations, no beavior problems, no confusion.  No unusual/severe anxiety, no depression   Past Medical History  Diagnosis Date  . Anxiety   . Hyperlipidemia     Past Surgical History  Procedure Laterality Date  . Hernia repair  12 yrs ago    R   . Warts  12/23/11    dermatologist froze them on back calfs bilaterally  . Umbilical hernia repair  12/24/2011    Procedure: HERNIA REPAIR UMBILICAL ADULT;  Surgeon: Gayland Curry, MD,FACS;  Location: [facility];  Service: General;  Laterality: N/A;  . Inguinal hernia repair  12/24/2011    Procedure: LAPAROSCOPIC BILATERAL INGUINAL HERNIA REPAIR;   Surgeon: Gayland Curry, MD,FACS;  Location: [facility];  Service: General;;  DR Redmond Pulling SPOKE WITH PT WIFE ABOUT REPAIRING THE UMBILICAL AND LEFT INGUINAL HERNIA BOTH WITH MESH .  ALL  RISKS WERE DISCUSSED WITH  HER OVER THE PHONE.  WIFE INSTRUCTED HIM TO PROCEED WITH REPAIRING THE LEFT INGUINAL AND UMBILICAL HERNIA     Social History   Social History  . Marital Status: Married    Spouse Name: N/A  . Number of Children: 2  . Years of Education: N/A   Occupational History  . IT    Social History Main Topics  . Smoking status: Former Smoker -- 0.50 packs/day for 10 years    Quit date: 04/30/2011  . Smokeless tobacco: Never Used     Comment: smoked 1979-2012 , up to 1 ppd. Off for total of 4 years.Smoked total 29 years.  . Alcohol Use: 0.6 oz/week    1 Standard drinks or equivalent per week     Comment: socially   . Drug Use: No  . Sexual Activity: Not on file   Other Topics Concern  . Not on file   Social History Narrative   Lives w/ wife        Medication List       This list is accurate as of: 01/22/15  9:47 AM.  Always use your most recent med list.  clobetasol ointment 0.05 %  Commonly known as:  TEMOVATE  Apply 0.05 g topically daily.     LORazepam 0.5 MG tablet  Commonly known as:  ATIVAN  Take 1 tablet (0.5 mg total) by mouth at bedtime. As needed.     PAPAYA AND ENZYMES PO  Take by mouth.           Objective:   Physical Exam BP 118/74 mmHg  Pulse 86  Temp(Src) 97.9 F (36.6 C) (Oral)  Ht 5' 11.5" (1.816 m)  Wt 222 lb (100.699 kg)  BMI 30.53 kg/m2  SpO2 97% General:   Well developed, well nourished . NAD.  HEENT:  Normocephalic . Face symmetric, atraumatic Neck: No thyromegaly Lungs:  CTA B Normal respiratory effort, no intercostal retractions, no accessory muscle use. Heart: RRR,  no murmur.  no pretibial edema bilaterally  Abdomen:  Not distended, soft, non-tender. No rebound or rigidity. No mass,organomegaly Skin: Not  pale. Not jaundice Neurologic:  alert & oriented X3.  Speech normal, gait appropriate for age and unassisted Psych--  Cognition and judgment appear intact.  Cooperative with normal attention span and concentration.  Behavior appropriate. No anxious or depressed appearing.    Assessment & Plan:   Assessment>  Anxiety, insomnia -- diazepam qhs, also uses nicotine gum at night Nicotine use (no tobacco) Hyperlipidemia Psoriasis (per derm)  Plan Anxiety and insomnia: Well controlled on diazepam, he also use a nicotine gum at night which help him calm down and sleep better. Needs a statement about a nicotine use, see letter. Hyperlipidemia: Doing great with lifestyle, labs.

## 2015-01-23 ENCOUNTER — Telehealth: Payer: Self-pay

## 2015-01-23 DIAGNOSIS — Z09 Encounter for follow-up examination after completed treatment for conditions other than malignant neoplasm: Secondary | ICD-10-CM | POA: Insufficient documentation

## 2015-01-23 LAB — HIV ANTIBODY (ROUTINE TESTING W REFLEX): HIV: NONREACTIVE

## 2015-01-23 LAB — HEPATITIS C ANTIBODY: HCV AB: NEGATIVE

## 2015-01-23 NOTE — Telephone Encounter (Signed)
Spoke with Pt, informed him that [facility] is ready for pick up at the front desk. Informed him of cholesterol results and blood glucose results. Also informed him that Dr. Larose Kells attached a letter regarding the use of his nicotine gum. Pt verbalized understanding and is [place] of the country. Stated he might have his wife run by to pick up or he will come by on Monday. Copies made of form and sent for scanning.

## 2015-01-23 NOTE — Assessment & Plan Note (Signed)
Anxiety and insomnia: Well controlled on diazepam, he also use a nicotine gum at night which help him calm down and sleep better. Needs a statement about a nicotine use, see letter. Hyperlipidemia: Doing great with lifestyle, labs.

## 2015-03-04 ENCOUNTER — Encounter: Payer: 59 | Admitting: Internal Medicine

## 2015-03-25 ENCOUNTER — Other Ambulatory Visit: Payer: Self-pay | Admitting: Internal Medicine

## 2015-03-26 NOTE — Telephone Encounter (Signed)
Pt is requesting refill on Lorazepam.  Last OV: 01/22/2015, CPE scheduled 12/17/2015 at 0800 Last Fill: 09/23/2014 #90 and 1RF Pt takes 1 tablet qhs PRN UDS: 05/09/2014 Low risk  Please advise.

## 2015-03-26 NOTE — Telephone Encounter (Signed)
Rx faxed to [facility].  

## 2015-03-26 NOTE — Telephone Encounter (Signed)
Ok 90 ans 2 RF

## 2015-03-26 NOTE — Telephone Encounter (Signed)
Rx printed, awaiting MD signature.  

## 2015-05-14 ENCOUNTER — Telehealth: Payer: Self-pay

## 2015-05-14 NOTE — Telephone Encounter (Signed)
UDS: 05/07/2015  Negative for Lorazepam:PRN   Low risk per Dr. Larose Kells 05/14/2015

## 2015-06-13 ENCOUNTER — Encounter: Payer: Self-pay | Admitting: Internal Medicine

## 2015-06-13 ENCOUNTER — Ambulatory Visit (INDEPENDENT_AMBULATORY_CARE_PROVIDER_SITE_OTHER): Payer: 59 | Admitting: Internal Medicine

## 2015-06-13 VITALS — BP 128/74 | HR 90 | Temp 98.1°F | Ht 71.5 in | Wt 231.0 lb

## 2015-06-13 DIAGNOSIS — R51 Headache: Secondary | ICD-10-CM

## 2015-06-13 DIAGNOSIS — R519 Headache, unspecified: Secondary | ICD-10-CM

## 2015-06-13 MED ORDER — ONDANSETRON HCL 4 MG PO TABS: 4.0000 mg | ORAL_TABLET | Freq: Three times a day (TID) | ORAL | Status: DC | PRN

## 2015-06-13 NOTE — Progress Notes (Signed)
Pre visit review using our clinic review tool, if applicable. No additional management support is needed unless otherwise documented below in the visit note. 

## 2015-06-13 NOTE — Patient Instructions (Signed)
Ok to take motrin sometimes  Call if no better in few days  Call or see a doctor if severe symptoms, rash, fever

## 2015-06-13 NOTE — Progress Notes (Signed)
Subjective:    Patient ID: Trevor Hurst, male    DOB: Sep 11, 1960, 55 y.o.   MRN: [id number]  DOS:  06/13/2015 Type of visit - description : Acute visit Interval history: Sx started about 10 days ago with throbbing left-sided headache, at the forehead and behind the eye; sometimes the pain feels like a superficial sensation or a burning feeling at the forehead. Went to the urgent care few days ago, diagnosed with possible sinusitis, prescribed Toradol 1 and augmetin that he already finished. Pain is now much improved, mild, responding well to OTC Motrin which he takes sporadically.  When asked, admits that he has a history of headaches, this headache is similar to others, not the worst of his life.  also admits to a lot of stress at work.  Review of Systems  Denies fever chills. Since the symptoms started he has not experienced runny nose, sore throat, sinus pain congestion, no nasal discharge or cough.  No neck pain, rash. No head injury No slurred speech, motor deficits or facial numbness.  Past Medical History  Diagnosis Date  . Anxiety   . Hyperlipidemia     Past Surgical History  Procedure Laterality Date  . Hernia repair  12 yrs ago    R   . Warts  12/23/11    dermatologist froze them on back calfs bilaterally  . Umbilical hernia repair  12/24/2011    Procedure: HERNIA REPAIR UMBILICAL ADULT;  Surgeon: Gayland Curry, MD,FACS;  Location: [facility];  Service: General;  Laterality: N/A;  . Inguinal hernia repair  12/24/2011    Procedure: LAPAROSCOPIC BILATERAL INGUINAL HERNIA REPAIR;  Surgeon: Gayland Curry, MD,FACS;  Location: [facility];  Service: General;;  DR Redmond Pulling SPOKE WITH PT WIFE ABOUT REPAIRING THE UMBILICAL AND LEFT INGUINAL HERNIA BOTH WITH MESH .  ALL  RISKS WERE DISCUSSED WITH  HER OVER THE PHONE.  WIFE INSTRUCTED HIM TO PROCEED WITH REPAIRING THE LEFT INGUINAL AND UMBILICAL HERNIA     Social History   Social History  . Marital Status: Married    Spouse Name: N/A    . Number of Children: 2  . Years of Education: N/A   Occupational History  . IT    Social History Main Topics  . Smoking status: Former Smoker -- 0.50 packs/day for 10 years    Quit date: 04/30/2011  . Smokeless tobacco: Never Used     Comment: smoked 1979-2012 , up to 1 ppd. Off for total of 4 years.Smoked total 29 years.  . Alcohol Use: 0.6 oz/week    1 Standard drinks or equivalent per week     Comment: socially   . Drug Use: No  . Sexual Activity: Not on file   Other Topics Concern  . Not on file   Social History Narrative   Lives w/ wife; 2 children, one in college        Medication List       This list is accurate as of: 06/13/15 11:59 PM.  Always use your most recent med list.               LORazepam 0.5 MG tablet  Commonly known as:  ATIVAN  Take 1 tablet (0.5 mg total) by mouth at bedtime as needed for sleep.     ondansetron 4 MG tablet  Commonly known as:  ZOFRAN  Take 1 tablet (4 mg total) by mouth every 8 (eight) hours as needed for nausea or vomiting.  PAPAYA AND ENZYMES PO  Take by mouth.           Objective:   Physical Exam BP 128/74 mmHg  Pulse 90  Temp(Src) 98.1 F (36.7 C) (Oral)  Ht 5' 11.5" (1.816 m)  Wt 231 lb (104.781 kg)  BMI 31.77 kg/m2  SpO2 97% General:   Well developed, well nourished . NAD.  Neck:  Full range of motion. Supple.   HEENT:  Normocephalic . Face symmetric, atraumatic. TMs normal, throat symmetric, nose not congested, no facial rash. EOMI, pupils equal and reactive, no conjunctival erythema, no photophobia noted. Lungs:  CTA B Normal respiratory effort, no intercostal retractions, no accessory muscle use. Heart: RRR,  no murmur.  No pretibial edema bilaterally  Abdomen:  Not distended, soft, non-tender. No rebound or rigidity. No mass,organomegaly Skin: Exposed areas without rash. Not pale. Not jaundice Neurologic:  alert & oriented X3.  Speech normal, gait appropriate for age and  unassisted Strength symmetric and appropriate for age. DTRs symmetric. Psych: Cognition and judgment appear intact.  Cooperative with normal attention span and concentration.  Behavior appropriate. No anxious or depressed appearing.    Assessment & Plan:   Assessment  Anxiety, insomnia -- diazepam qhs, also uses nicotine gum at night Nicotine use (no tobacco) Hyperlipidemia Psoriasis (per derm)  PLAN 06-13-2015  HA: Left-sided pain consistent with headache, I'm not sure if he truly had sinusitis. The patient now feels better, no red flag symptoms . He describes the pain as a burning at the forehead  but he denies any rash. At this point we agreed on conservative treatment with occasional Motrin. See AVS Also, flying to [place] soon, in the past he use Zofran because he gets slightly nauseous on the airplane, a prescription is provided at patient request.   Previous plans  01-22-15 Anxiety and insomnia: Well controlled on diazepam, he also use a nicotine gum at night which help him calm down and sleep better. Needs a statement about a nicotine use, see letter. Hyperlipidemia: Doing great with lifestyle, labs.

## 2015-09-27 ENCOUNTER — Other Ambulatory Visit: Payer: Self-pay | Admitting: Internal Medicine

## 2015-09-30 NOTE — Telephone Encounter (Signed)
Pt is requesting refill on Lorazepam 0.5mg . Dr. Ethel Rana Pt.   Last OV: 06/13/2015 Last Fill: 03/26/2015 #90 and 2RF 1 tablet qhs PRN UDS: 05/07/2015 Low risk  Please advise.

## 2015-09-30 NOTE — Telephone Encounter (Signed)
OK to refill Lorazepam same strength, same strength, disp #90 no refills needs UDS

## 2015-09-30 NOTE — Telephone Encounter (Signed)
Rx faxed to [facility] in [place].

## 2015-09-30 NOTE — Telephone Encounter (Signed)
Rx printed, awaiting MD signature. (Dr. Larose Kells only repeats UDS' if moderate or high risk, low risk Pt's only repeated yearly).

## 2015-12-17 ENCOUNTER — Encounter: Payer: Self-pay | Admitting: Internal Medicine

## 2015-12-17 ENCOUNTER — Ambulatory Visit (INDEPENDENT_AMBULATORY_CARE_PROVIDER_SITE_OTHER): Payer: 59 | Admitting: Internal Medicine

## 2015-12-17 VITALS — BP 122/78 | HR 80 | Temp 97.8°F | Resp 12 | Ht 72.0 in | Wt 222.4 lb

## 2015-12-17 DIAGNOSIS — Z0000 Encounter for general adult medical examination without abnormal findings: Secondary | ICD-10-CM

## 2015-12-17 LAB — LIPID PANEL
CHOLESTEROL: 216 mg/dL — AB (ref 0–200)
HDL: 40.7 mg/dL (ref >39.00)
LDL Cholesterol: 144 mg/dL — ABNORMAL HIGH (ref 0–99)
NONHDL: 175.34
Total CHOL/HDL Ratio: 5
Triglycerides: 155 mg/dL — ABNORMAL HIGH (ref 0.0–149.0)
VLDL: 31 mg/dL (ref 0.0–40.0)

## 2015-12-17 LAB — CBC WITH DIFFERENTIAL/PLATELET
BASOS ABS: 0 10*3/uL (ref 0.0–0.1)
Basophils Relative: 0.4 % (ref 0.0–3.0)
EOS ABS: 0.1 10*3/uL (ref 0.0–0.7)
EOS PCT: 1.1 % (ref 0.0–5.0)
HCT: 46.5 % (ref 39.0–52.0)
HEMOGLOBIN: 16 g/dL (ref 13.0–17.0)
LYMPHS ABS: 1.5 10*3/uL (ref 0.7–4.0)
LYMPHS PCT: 24.6 % (ref 12.0–46.0)
MCHC: 34.3 g/dL (ref 30.0–36.0)
MCV: 85.9 fl (ref 78.0–100.0)
MONO ABS: 0.5 10*3/uL (ref 0.1–1.0)
MONOS PCT: 8.3 % (ref 3.0–12.0)
NEUTROS PCT: 65.6 % (ref 43.0–77.0)
Neutro Abs: 3.9 10*3/uL (ref 1.4–7.7)
Platelets: 172 10*3/uL (ref 150.0–400.0)
RBC: 5.41 Mil/uL (ref 4.22–5.81)
RDW: 13.2 % (ref 11.5–15.5)
WBC: 5.9 10*3/uL (ref 4.0–10.5)

## 2015-12-17 LAB — BASIC METABOLIC PANEL
BUN: 13 mg/dL (ref 6–23)
CALCIUM: 9.6 mg/dL (ref 8.4–10.5)
CO2: 28 mEq/L (ref 19–32)
CREATININE: 0.84 mg/dL (ref 0.40–1.50)
Chloride: 103 mEq/L (ref 96–112)
GFR: 101.22 mL/min (ref >60.00)
Glucose, Bld: 92 mg/dL (ref 70–99)
POTASSIUM: 4 meq/L (ref 3.5–5.1)
Sodium: 138 mEq/L (ref 135–145)

## 2015-12-17 LAB — PSA: PSA: 2.53 ng/mL (ref 0.10–4.00)

## 2015-12-17 MED ORDER — LORAZEPAM 0.5 MG PO TABS: 0.5000 mg | ORAL_TABLET | Freq: Every evening | ORAL | 0 refills | Status: DC | PRN

## 2015-12-17 MED ORDER — ONDANSETRON HCL 4 MG PO TABS: 4.0000 mg | ORAL_TABLET | Freq: Three times a day (TID) | ORAL | 0 refills | Status: DC | PRN

## 2015-12-17 NOTE — Assessment & Plan Note (Signed)
Anxiety insomnia: Controlled. Nicotine use: Letter to help obtain nicotine supplements provided Midline abdominal hernia: It has resurface after few years. Incarceration symptoms discussed, has a pending appointment with surgery. Going to [place] for work, request a nausea medication. See prescriptions. RTC one year.

## 2015-12-17 NOTE — Progress Notes (Signed)
Subjective:    Patient ID: Trevor Hurst, male    DOB: [birth year], 55 y.o.   MRN: [id number]  DOS:  12/17/2015 Type of visit - description : CPX Interval history: Feeling well, has improved his diet, he is very active, has lost some weight  Wt Readings from Last 3 Encounters:  12/17/15 222 lb 6 oz (100.9 kg)  06/13/15 231 lb (104.8 kg)  01/22/15 222 lb (100.7 kg)    Review of Systems Constitutional: No fever. No chills. No unexplained wt changes. No unusual sweats  HEENT: No dental problems, no ear discharge, no facial swelling, no voice changes. No eye discharge, no eye  redness , no  intolerance to light   Respiratory: No wheezing , no  difficulty breathing. No cough , no mucus production  Cardiovascular: No CP, no leg swelling , no  Palpitations  GI: no nausea, no vomiting, no diarrhea , no  abdominal pain.  No blood in the stools. No dysphagia, no odynophagia    Endocrine: No polyphagia, no polyuria , no polydipsia  GU: No dysuria, gross hematuria, difficulty urinating. No urinary urgency, no frequency.  Musculoskeletal: No joint swellings or unusual aches or pains  Skin: No change in the color of the skin, palor. Occasionally the skin at the sacral area is itchy, usually when he sweats, Lamisil as needed helps. Allergic, immunologic: No environmental allergies , no  food allergies  Neurological: No dizziness no  syncope. No headaches. No diplopia, no slurred, no slurred speech, no motor deficits, no facial  Numbness  Hematological: No enlarged lymph nodes, no easy bruising , no unusual bleedings  Psychiatry: No suicidal ideas, no hallucinations, no beavior problems, no confusion.  No unusual/severe anxiety, no depression   Past Medical History:  Diagnosis Date  . Anxiety   . Hyperlipidemia     Past Surgical History:  Procedure Laterality Date  . HERNIA REPAIR  12 yrs ago   R   . INGUINAL HERNIA REPAIR  12/24/2011   Procedure: LAPAROSCOPIC BILATERAL  INGUINAL HERNIA REPAIR;  Surgeon: Gayland Curry, MD,FACS;  Location: [facility];  Service: General;;  DR Redmond Pulling SPOKE WITH PT WIFE ABOUT REPAIRING THE UMBILICAL AND LEFT INGUINAL HERNIA BOTH WITH MESH .  ALL  RISKS WERE DISCUSSED WITH  HER OVER THE PHONE.  WIFE INSTRUCTED HIM TO PROCEED WITH REPAIRING THE LEFT INGUINAL AND UMBILICAL HERNIA   . UMBILICAL HERNIA REPAIR  12/24/2011   Procedure: HERNIA REPAIR UMBILICAL ADULT;  Surgeon: Gayland Curry, MD,FACS;  Location: [facility];  Service: General;  Laterality: N/A;  . warts  12/23/11   dermatologist froze them on back calfs bilaterally    Social History   Social History  . Marital status: Married    Spouse name: N/A  . Number of children: 2  . Years of education: N/A   Occupational History  . [employer]   Social History Main Topics  . Smoking status: Former Smoker    Packs/day: 1.00    Years: 10.00    Quit date: 04/30/2011  . Smokeless tobacco: Never Used     Comment: smoked 1979-2012 , up to 1 ppd. Off for total of 4 years.Smoked total 29 years.  . Alcohol use 0.6 oz/week    1 Standard drinks or equivalent per week     Comment: socially   . Drug use: No  . Sexual activity: Not on file   Other Topics Concern  . Not on file   Social History  Narrative   Lives w/ wife;   one in college, one graduate      Family History  Problem Relation Age of Onset  . Lung cancer Father     was a smoker  . Transient ischemic attack Mother   . Depression Sister   . Diabetes Paternal Grandfather   . Heart disease Neg Hx   . Colon cancer Neg Hx   . Prostate cancer Neg Hx        Medication List       Accurate as of 12/17/15  4:19 PM. Always use your most recent med list.          LORazepam 0.5 MG tablet Commonly known as:  ATIVAN Take 1 tablet (0.5 mg total) by mouth at bedtime as needed for sleep.   nicotine polacrilex 4 MG gum Commonly known as:  NICORETTE Take 4 mg by mouth daily as needed for smoking cessation.   ondansetron 4  MG tablet Commonly known as:  ZOFRAN Take 1 tablet (4 mg total) by mouth every 8 (eight) hours as needed for nausea or vomiting.   PAPAYA AND ENZYMES PO Take by mouth.          Objective:   Physical Exam BP 122/78 (BP Location: Left Arm, Patient Position: Sitting, Cuff Size: Normal)   Pulse 80   Temp 97.8 F (36.6 C) (Oral)   Resp 12   Ht 6' (1.829 m)   Wt 222 lb 6 oz (100.9 kg)   SpO2 98%   BMI 30.16 kg/m   General:   Well developed, well nourished . NAD.  Neck: No  thyromegaly  HEENT:  Normocephalic . Face symmetric, atraumatic Lungs:  CTA B Normal respiratory effort, no intercostal retractions, no accessory muscle use. Heart: RRR,  no murmur.  No pretibial edema bilaterally  Abdomen:  Not distended, soft, non-tender. No rebound or rigidity. Reducible, nontender, supraumbilical midline hernia noted. Around 21 inch  Skin: Exposed areas without rash. Not pale. Not jaundice Rectal:  External abnormalities: none. Normal sphincter tone. No rectal masses or tenderness.  No stools found Prostate: Prostate gland firm and smooth, no enlargement, nodularity, tenderness, mass, asymmetry or induration.  Neurologic:  alert & oriented X3.  Speech normal, gait appropriate for age and unassisted Strength symmetric and appropriate for age.  Psych: Cognition and judgment appear intact.  Cooperative with normal attention span and concentration.  Behavior appropriate. No anxious or depressed appearing.    Assessment & Plan:   Assessment  Anxiety, insomnia -- diazepam qhs, also uses nicotine gum at night Nicotine use (no tobacco) Hyperlipidemia Psoriasis (per derm) Pre-ca skin lesion R knee   PLAN: Anxiety insomnia: Controlled. Nicotine use: Letter to help obtain nicotine supplements provided Midline abdominal hernia: It has resurface after few years. Incarceration symptoms discussed, has a pending appointment with surgery. Going to [place] for work, request a nausea  medication. See prescriptions. RTC one year.

## 2015-12-17 NOTE — Patient Instructions (Signed)
GO TO THE LAB : Get the blood work     GO TO THE FRONT DESK Schedule your next appointment for a  physical exam in one year  

## 2015-12-17 NOTE — Assessment & Plan Note (Signed)
Td 2016 CCS cscope 11-2014, next per GI Prostate cancer screening, DRE normal, checking a PSA Former smoker, + FH lung cancer, lung cancer screening discussed, patient will call if interested Doing very well with diet and exercise Labs : BMP, FLP, CBC, PSA RTC 1 year

## 2015-12-17 NOTE — Progress Notes (Signed)
Pre visit review using our clinic review tool, if applicable. No additional management support is needed unless otherwise documented below in the visit note. 

## 2016-01-08 ENCOUNTER — Other Ambulatory Visit: Payer: Self-pay | Admitting: General Surgery

## 2016-01-08 DIAGNOSIS — K432 Incisional hernia without obstruction or gangrene: Secondary | ICD-10-CM

## 2016-01-16 ENCOUNTER — Ambulatory Visit
Admission: RE | Admit: 2016-01-16 | Discharge: 2016-01-16 | Disposition: A | Discharge status: Home / Self Care | Payer: 59 | Source: Ambulatory Visit | Attending: General Surgery | Admitting: General Surgery

## 2016-01-16 DIAGNOSIS — K432 Incisional hernia without obstruction or gangrene: Secondary | ICD-10-CM

## 2016-01-16 MED ORDER — IOPAMIDOL (ISOVUE-300) INJECTION 61%
125.0000 mL | Freq: Once | INTRAVENOUS | Status: AC | PRN
  Administered 2016-01-16: 125 mL via INTRAVENOUS

## 2016-02-04 ENCOUNTER — Ambulatory Visit: Payer: Self-pay | Admitting: General Surgery

## 2016-02-17 ENCOUNTER — Ambulatory Visit (INDEPENDENT_AMBULATORY_CARE_PROVIDER_SITE_OTHER): Payer: 59 | Admitting: Internal Medicine

## 2016-02-17 ENCOUNTER — Encounter: Payer: Self-pay | Admitting: Internal Medicine

## 2016-02-17 DIAGNOSIS — K4090 Unilateral inguinal hernia, without obstruction or gangrene, not specified as recurrent: Secondary | ICD-10-CM

## 2016-02-17 NOTE — Progress Notes (Deleted)
Pre visit review using our clinic review tool, if applicable. No additional management support is needed unless otherwise documented below in the visit note. 

## 2016-02-17 NOTE — Patient Instructions (Signed)
REGINO SERIE  02/17/2016   Your procedure is scheduled on: 02/24/2016    Report to [facility]  Entrance take [place]  elevators to 3rd floor to  [place] at    [place] AM.  Call this number if you have problems the morning of surgery [phone]   Remember: ONLY 1 PERSON MAY GO WITH YOU TO SHORT STAY TO GET  READY MORNING OF [place].  Do not eat food or drink liquids :After Midnight.     Take these medicines the morning of surgery with A SIP OF WATER: [place]                                 You may not have any metal on your body including hair pins and              piercings  Do not wear jewelry, , lotions, powders or perfumes, deodorant                    Men may shave face and neck.   Do not bring valuables to the hospital. [place].  Contacts, dentures or bridgework may not be worn into surgery.  Leave suitcase in the car. After surgery it may be brought to your room.     Marland Kitchen   Special Instructions: N/A              Please read over the following fact sheets you were given: _____________________________________________________________________             [facility] - Preparing for Surgery Before surgery, you can play an important role.  Because skin is not sterile, your skin needs to be as free of germs as possible.  You can reduce the number of germs on your skin by washing with CHG (chlorahexidine gluconate) soap before surgery.  CHG is an antiseptic cleaner which kills germs and bonds with the skin to continue killing germs even after washing. Please DO NOT use if you have an allergy to CHG or antibacterial soaps.  If your skin becomes reddened/irritated stop using the CHG and inform your nurse when you arrive at Short Stay. Do not shave (including legs and underarms) for at least 48 hours prior to the first CHG shower.  You may shave your face/neck. Please follow these  instructions carefully:  1.  Shower with CHG Soap the night before surgery and the  morning of Surgery.  2.  If you choose to wash your hair, wash your hair first as usual with your  normal  shampoo.  3.  After you shampoo, rinse your hair and body thoroughly to remove the  shampoo.                           4.  Use CHG as you would any other liquid soap.  You can apply chg directly  to the skin and wash                       Gently with a scrungie or clean washcloth.  5.  Apply the CHG Soap to your body ONLY FROM THE NECK DOWN.  Do not use on face/ open                           Wound or open sores. Avoid contact with eyes, ears mouth and genitals (private parts).                       Wash face,  Genitals (private parts) with your normal soap.             6.  Wash thoroughly, paying special attention to the area where your surgery  will be performed.  7.  Thoroughly rinse your body with warm water from the neck down.  8.  DO NOT shower/wash with your normal soap after using and rinsing off  the CHG Soap.                9.  Pat yourself dry with a clean towel.            10.  Wear clean pajamas.            11.  Place clean sheets on your bed the night of your first shower and do not  sleep with pets. Day of Surgery : Do not apply any lotions/deodorants the morning of surgery.  Please wear clean clothes to the hospital/surgery center.  FAILURE TO FOLLOW THESE INSTRUCTIONS MAY RESULT IN THE CANCELLATION OF YOUR SURGERY PATIENT SIGNATURE_________________________________  NURSE SIGNATURE__________________________________  ________________________________________________________________________

## 2016-02-18 NOTE — Progress Notes (Signed)
DID NOT NEED TO BE SEEN

## 2016-02-19 ENCOUNTER — Encounter (HOSPITAL_COMMUNITY)
Admission: RE | Admit: 2016-02-19 | Discharge: 2016-02-19 | Disposition: A | Discharge status: Home / Self Care | Payer: 59 | Source: Ambulatory Visit | Attending: General Surgery | Admitting: General Surgery

## 2016-02-19 ENCOUNTER — Encounter (HOSPITAL_COMMUNITY): Payer: Self-pay

## 2016-02-19 DIAGNOSIS — K439 Ventral hernia without obstruction or gangrene: Secondary | ICD-10-CM | POA: Diagnosis not present

## 2016-02-19 DIAGNOSIS — Z01818 Encounter for other preprocedural examination: Secondary | ICD-10-CM | POA: Insufficient documentation

## 2016-02-19 HISTORY — DX: Other specified postprocedural states: R11.2

## 2016-02-19 HISTORY — DX: Other specified postprocedural states: Z98.890

## 2016-02-19 HISTORY — DX: Other complications of anesthesia, initial encounter: T88.59XA

## 2016-02-19 HISTORY — DX: Adverse effect of unspecified anesthetic, initial encounter: T41.45XA

## 2016-02-19 LAB — CBC
HEMATOCRIT: 46.3 % (ref 39.0–52.0)
Hemoglobin: 16 g/dL (ref 13.0–17.0)
MCH: 29.9 pg (ref 26.0–34.0)
MCHC: 34.6 g/dL (ref 30.0–36.0)
MCV: 86.5 fL (ref 78.0–100.0)
Platelets: 170 10*3/uL (ref 150–400)
RBC: 5.35 MIL/uL (ref 4.22–5.81)
RDW: 12.6 % (ref 11.5–15.5)
WBC: 6.2 10*3/uL (ref 4.0–10.5)

## 2016-02-23 ENCOUNTER — Encounter: Payer: Self-pay | Admitting: Internal Medicine

## 2016-02-23 ENCOUNTER — Ambulatory Visit (INDEPENDENT_AMBULATORY_CARE_PROVIDER_SITE_OTHER): Payer: 59 | Admitting: Internal Medicine

## 2016-02-23 VITALS — BP 124/80 | HR 72 | Temp 98.2°F | Resp 14 | Ht 72.0 in | Wt 226.0 lb

## 2016-02-23 DIAGNOSIS — S335XXA Sprain of ligaments of lumbar spine, initial encounter: Secondary | ICD-10-CM

## 2016-02-23 DIAGNOSIS — S339XXA Sprain of unspecified parts of lumbar spine and pelvis, initial encounter: Secondary | ICD-10-CM

## 2016-02-23 MED ORDER — CYCLOBENZAPRINE HCL 10 MG PO TABS
10.0000 mg | ORAL_TABLET | Freq: Every evening | ORAL | 0 refills | Status: DC | PRN
Start: 2016-02-23 — End: 2016-03-11

## 2016-02-23 MED ORDER — TRAMADOL HCL 50 MG PO TABS: 25.0000 mg | ORAL_TABLET | Freq: Four times a day (QID) | ORAL | 0 refills | Status: DC | PRN

## 2016-02-23 NOTE — Patient Instructions (Addendum)
Rest   Warm compress   Tylenol  500 mg OTC 2 tabs a day every 8 hours as needed for pain   Flexeril (muscle relaxant ) at night   Tramadol only as needed, may cause drowsiness   Avoid motrin

## 2016-02-23 NOTE — Progress Notes (Signed)
Patient called in and spoke with Hassan Rowan, [place] regarding back injury over weekend.  He stated to [facility] that he was going to see ? MD.  Called back and left voice mail message for pateint on phone- [phone].  To please notify Dr Greer Pickerel- surgeon.

## 2016-02-23 NOTE — Progress Notes (Signed)
Pre visit review using our clinic review tool, if applicable. No additional management support is needed unless otherwise documented below in the visit note. 

## 2016-02-23 NOTE — Progress Notes (Signed)
Subjective:    Patient ID: Trevor Hurst, male    DOB: 06-06-1960, 55 y.o.   MRN: [id number]  DOS:  02/23/2016 Type of visit - description : acute Interval history: The patient did yard work 2 days ago, he did feel some pulling at the left lower back at that time however the next day developed a steady pain at the left lower back, some radiation to the buttock. Did take some Motrin and put on a heating pad which helped to some extent. Denies any actual injury, fever chills, lower extremity paresthesias. He is scheduled to have a hernia repair tomorrow.    Review of Systems   Past Medical History:  Diagnosis Date  . Anxiety   . Complication of anesthesia   . Hyperlipidemia   . PONV (postoperative nausea and vomiting)     Past Surgical History:  Procedure Laterality Date  . HERNIA REPAIR  12 yrs ago   R   . INGUINAL HERNIA REPAIR  12/24/2011   Procedure: LAPAROSCOPIC BILATERAL INGUINAL HERNIA REPAIR;  Surgeon: Gayland Curry, MD,FACS;  Location: [facility];  Service: General;;  DR Redmond Pulling SPOKE WITH PT WIFE ABOUT REPAIRING THE UMBILICAL AND LEFT INGUINAL HERNIA BOTH WITH MESH .  ALL  RISKS WERE DISCUSSED WITH  HER OVER THE PHONE.  WIFE INSTRUCTED HIM TO PROCEED WITH REPAIRING THE LEFT INGUINAL AND UMBILICAL HERNIA   . UMBILICAL HERNIA REPAIR  12/24/2011   Procedure: HERNIA REPAIR UMBILICAL ADULT;  Surgeon: Gayland Curry, MD,FACS;  Location: [facility];  Service: General;  Laterality: N/A;  . warts  12/23/11   dermatologist froze them on back calfs bilaterally    Social History   Social History  . Marital status: Married    Spouse name: [name]  . Number of children: 2  . Years of education: N/A   Occupational History  . [employer]   Social History Main Topics  . Smoking status: Former Smoker    Packs/day: 1.00    Years: 10.00    Quit date: 04/30/2011  . Smokeless tobacco: Never Used     Comment: smoked 1979-2012 , up to 1 ppd. Off for total of 4 years.Smoked total 29 years.    . Alcohol use No  . Drug use: No  . Sexual activity: Not on file   Other Topics Concern  . Not on file   Social History Narrative   Lives w/ wife;   one in college, one graduate         Medication List       Accurate as of 02/23/16 11:59 PM. Always use your most recent med list.          b complex vitamins capsule Take 1 capsule by mouth daily with lunch.   clobetasol ointment 0.05 % Commonly known as:  TEMOVATE Apply 1 application topically every other day.   cyclobenzaprine 10 MG tablet Commonly known as:  FLEXERIL Take 1 tablet (10 mg total) by mouth at bedtime as needed for muscle spasms.   fexofenadine 180 MG tablet Commonly known as:  ALLEGRA Take 180 mg by mouth daily.   ibuprofen 200 MG tablet Commonly known as:  ADVIL,MOTRIN Take 400 mg by mouth every 6 (six) hours as needed (for pain.).   LORazepam 0.5 MG tablet Commonly known as:  ATIVAN Take 1 tablet (0.5 mg total) by mouth at bedtime as needed for sleep.   nicotine polacrilex 4 MG gum Commonly known as:  NICORETTE Take 4  mg by mouth 4 (four) times daily as needed for smoking cessation.   ondansetron 4 MG tablet Commonly known as:  ZOFRAN Take 1 tablet (4 mg total) by mouth every 8 (eight) hours as needed for nausea or vomiting.   PAPAYA AND ENZYMES PO Take 1 capsule by mouth 2 (two) times daily after a meal. Lunch and Dinner.   traMADol 50 MG tablet Commonly known as:  ULTRAM Take 0.5 tablets (25 mg total) by mouth every 6 (six) hours as needed.          Objective:   Physical Exam BP 124/80 (BP Location: Left Arm, Patient Position: Sitting, Cuff Size: Normal)   Pulse 72   Temp 98.2 F (36.8 C) (Oral)   Resp 14   Ht 6' (1.829 m)   Wt 226 lb (102.5 kg)   SpO2 97%   BMI 30.65 kg/m  General:   Well developed, well nourished. + Antalgic gait and posture.  HEENT:  Normocephalic . Face symmetric, atraumatic MSK: No TTP at the lower back  Skin: Not pale. Not jaundice Neurologic:   alert & oriented X3.  Speech normal,  DTRs and motor exam symmetric. Straight leg test negative. Definitely uncomfortable when he laid down in the examining table. Psych--  Cognition and judgment appear intact.  Cooperative with normal attention span and concentration.  Behavior appropriate. No anxious or depressed appearing.      Assessment & Plan:  Assessment  Anxiety, insomnia -- diazepam qhs, also uses nicotine gum at night Nicotine use (no tobacco) Hyperlipidemia Psoriasis (per derm) Pre-ca skin lesion R knee   PLAN: Back sprain: Acute back pain after doing yard work, no radiculopathy on clinical grounds. Patient is concerned b/c has a scheduled hernia repair surgery tomorrow and does not like to cancel. For that reason I will avoid NSAIDs and prednisone. Recommend rest, warm compress, Tylenol. Also Flexeril and Ultram ; recommend him to be extremely cautious as we don't know if he is going to become very somnolent w/ those medications (as he did w/ oxycodone). Additionally recommend to discuss above recommendations with the surgery office.

## 2016-02-24 ENCOUNTER — Encounter (HOSPITAL_COMMUNITY): Payer: Self-pay

## 2016-02-24 ENCOUNTER — Encounter (HOSPITAL_COMMUNITY)
Admission: RE | Disposition: A | Discharge status: Home / Self Care | Payer: Self-pay | Source: Ambulatory Visit | Attending: General Surgery

## 2016-02-24 ENCOUNTER — Ambulatory Visit (HOSPITAL_COMMUNITY): Payer: 59 | Admitting: Anesthesiology

## 2016-02-24 ENCOUNTER — Observation Stay (HOSPITAL_COMMUNITY)
Admission: RE | Admit: 2016-02-24 | Discharge: 2016-02-25 | Disposition: A | Discharge status: Home / Self Care | Payer: 59 | Source: Ambulatory Visit | Attending: General Surgery | Admitting: General Surgery

## 2016-02-24 DIAGNOSIS — K439 Ventral hernia without obstruction or gangrene: Secondary | ICD-10-CM | POA: Diagnosis not present

## 2016-02-24 DIAGNOSIS — Z823 Family history of stroke: Secondary | ICD-10-CM | POA: Diagnosis not present

## 2016-02-24 DIAGNOSIS — E785 Hyperlipidemia, unspecified: Secondary | ICD-10-CM | POA: Insufficient documentation

## 2016-02-24 DIAGNOSIS — F419 Anxiety disorder, unspecified: Secondary | ICD-10-CM | POA: Insufficient documentation

## 2016-02-24 DIAGNOSIS — Z885 Allergy status to narcotic agent status: Secondary | ICD-10-CM | POA: Diagnosis not present

## 2016-02-24 DIAGNOSIS — E669 Obesity, unspecified: Secondary | ICD-10-CM | POA: Diagnosis not present

## 2016-02-24 DIAGNOSIS — Z87891 Personal history of nicotine dependence: Secondary | ICD-10-CM | POA: Diagnosis not present

## 2016-02-24 DIAGNOSIS — Z6830 Body mass index (BMI) 30.0-30.9, adult: Secondary | ICD-10-CM | POA: Diagnosis not present

## 2016-02-24 DIAGNOSIS — Z8719 Personal history of other diseases of the digestive system: Secondary | ICD-10-CM

## 2016-02-24 DIAGNOSIS — Z801 Family history of malignant neoplasm of trachea, bronchus and lung: Secondary | ICD-10-CM | POA: Insufficient documentation

## 2016-02-24 DIAGNOSIS — K432 Incisional hernia without obstruction or gangrene: Secondary | ICD-10-CM | POA: Diagnosis present

## 2016-02-24 DIAGNOSIS — Z9889 Other specified postprocedural states: Secondary | ICD-10-CM

## 2016-02-24 HISTORY — PX: VENTRAL HERNIA REPAIR: SHX424

## 2016-02-24 HISTORY — PX: INSERTION OF MESH: SHX5868

## 2016-02-24 SURGERY — REPAIR, HERNIA, VENTRAL, LAPAROSCOPIC
Anesthesia: General | Site: Abdomen

## 2016-02-24 MED ORDER — TRAMADOL HCL 50 MG PO TABS
50.0000 mg | ORAL_TABLET | Freq: Four times a day (QID) | ORAL | Status: DC | PRN
  Administered 2016-02-24: 50 mg via ORAL
  Filled 2016-02-24: qty 1

## 2016-02-24 MED ORDER — SIMETHICONE 80 MG PO CHEW: 40.0000 mg | CHEWABLE_TABLET | Freq: Four times a day (QID) | ORAL | Status: DC | PRN

## 2016-02-24 MED ORDER — DIPHENHYDRAMINE HCL 12.5 MG/5ML PO ELIX: 12.5000 mg | ORAL_SOLUTION | Freq: Four times a day (QID) | ORAL | Status: DC | PRN

## 2016-02-24 MED ORDER — CEFAZOLIN SODIUM-DEXTROSE 2-4 GM/100ML-% IV SOLN
2.0000 g | INTRAVENOUS | Status: AC
  Administered 2016-02-24: 2 g via INTRAVENOUS
  Filled 2016-02-24: qty 100

## 2016-02-24 MED ORDER — METHOCARBAMOL 500 MG PO TABS: 500.0000 mg | ORAL_TABLET | Freq: Four times a day (QID) | ORAL | Status: DC | PRN

## 2016-02-24 MED ORDER — BUPIVACAINE HCL (PF) 0.5 % IJ SOLN
INTRAMUSCULAR | Status: AC
  Filled 2016-02-24: qty 30

## 2016-02-24 MED ORDER — ACETAMINOPHEN 500 MG PO TABS
1000.0000 mg | ORAL_TABLET | ORAL | Status: AC
  Administered 2016-02-24: 1000 mg via ORAL
  Filled 2016-02-24: qty 2

## 2016-02-24 MED ORDER — FENTANYL CITRATE (PF) 100 MCG/2ML IJ SOLN
INTRAMUSCULAR | Status: AC
  Filled 2016-02-24: qty 2

## 2016-02-24 MED ORDER — GABAPENTIN 300 MG PO CAPS
300.0000 mg | ORAL_CAPSULE | ORAL | Status: AC
  Administered 2016-02-24: 300 mg via ORAL
  Filled 2016-02-24: qty 1

## 2016-02-24 MED ORDER — PROMETHAZINE HCL 25 MG/ML IJ SOLN: 6.2500 mg | INTRAMUSCULAR | Status: DC | PRN

## 2016-02-24 MED ORDER — CEFAZOLIN SODIUM-DEXTROSE 2-4 GM/100ML-% IV SOLN
INTRAVENOUS | Status: AC
  Filled 2016-02-24: qty 100

## 2016-02-24 MED ORDER — KCL IN DEXTROSE-NACL 20-5-0.45 MEQ/L-%-% IV SOLN
INTRAVENOUS | Status: DC
  Administered 2016-02-24: 50 mL/h via INTRAVENOUS
  Filled 2016-02-24: qty 1000

## 2016-02-24 MED ORDER — FENTANYL CITRATE (PF) 100 MCG/2ML IJ SOLN
25.0000 ug | INTRAMUSCULAR | Status: DC | PRN
  Administered 2016-02-24 (×3): 50 ug via INTRAVENOUS

## 2016-02-24 MED ORDER — ACETAMINOPHEN 500 MG PO TABS
1000.0000 mg | ORAL_TABLET | Freq: Four times a day (QID) | ORAL | Status: DC
  Administered 2016-02-24 – 2016-02-25 (×4): 1000 mg via ORAL
  Filled 2016-02-24 (×4): qty 2

## 2016-02-24 MED ORDER — LIDOCAINE 2% (20 MG/ML) 5 ML SYRINGE
INTRAMUSCULAR | Status: AC
  Filled 2016-02-24: qty 5

## 2016-02-24 MED ORDER — ROCURONIUM BROMIDE 50 MG/5ML IV SOSY
PREFILLED_SYRINGE | INTRAVENOUS | Status: DC | PRN
  Administered 2016-02-24: 10 mg via INTRAVENOUS
  Administered 2016-02-24: 20 mg via INTRAVENOUS
  Administered 2016-02-24: 30 mg via INTRAVENOUS
  Administered 2016-02-24: 10 mg via INTRAVENOUS

## 2016-02-24 MED ORDER — FENTANYL CITRATE (PF) 100 MCG/2ML IJ SOLN
INTRAMUSCULAR | Status: AC
Start: 2016-02-24 — End: 2016-02-24
  Filled 2016-02-24: qty 2

## 2016-02-24 MED ORDER — PROPOFOL 10 MG/ML IV BOLUS
INTRAVENOUS | Status: AC
  Filled 2016-02-24: qty 20

## 2016-02-24 MED ORDER — DEXAMETHASONE SODIUM PHOSPHATE 10 MG/ML IJ SOLN
INTRAMUSCULAR | Status: DC | PRN
  Administered 2016-02-24: 10 mg via INTRAVENOUS

## 2016-02-24 MED ORDER — CHLORHEXIDINE GLUCONATE CLOTH 2 % EX PADS: 6.0000 | MEDICATED_PAD | Freq: Once | CUTANEOUS | Status: DC

## 2016-02-24 MED ORDER — MIDAZOLAM HCL 5 MG/5ML IJ SOLN
INTRAMUSCULAR | Status: DC | PRN
  Administered 2016-02-24: 2 mg via INTRAVENOUS

## 2016-02-24 MED ORDER — ONDANSETRON HCL 4 MG/2ML IJ SOLN
INTRAMUSCULAR | Status: DC | PRN
  Administered 2016-02-24: 4 mg via INTRAVENOUS

## 2016-02-24 MED ORDER — MIDAZOLAM HCL 2 MG/2ML IJ SOLN
INTRAMUSCULAR | Status: AC
  Filled 2016-02-24: qty 2

## 2016-02-24 MED ORDER — ROCURONIUM BROMIDE 50 MG/5ML IV SOSY
PREFILLED_SYRINGE | INTRAVENOUS | Status: AC
  Filled 2016-02-24: qty 5

## 2016-02-24 MED ORDER — SUGAMMADEX SODIUM 200 MG/2ML IV SOLN
INTRAVENOUS | Status: AC
  Filled 2016-02-24: qty 2

## 2016-02-24 MED ORDER — ENOXAPARIN SODIUM 40 MG/0.4ML ~~LOC~~ SOLN
40.0000 mg | SUBCUTANEOUS | Status: DC
  Administered 2016-02-25: 40 mg via SUBCUTANEOUS
  Filled 2016-02-24: qty 0.4

## 2016-02-24 MED ORDER — FENTANYL CITRATE (PF) 100 MCG/2ML IJ SOLN
INTRAMUSCULAR | Status: AC
  Administered 2016-02-24: 50 ug via INTRAVENOUS
  Filled 2016-02-24: qty 2

## 2016-02-24 MED ORDER — FENTANYL CITRATE (PF) 100 MCG/2ML IJ SOLN
INTRAMUSCULAR | Status: DC | PRN
  Administered 2016-02-24 (×4): 50 ug via INTRAVENOUS
  Administered 2016-02-24: 100 ug via INTRAVENOUS

## 2016-02-24 MED ORDER — DIPHENHYDRAMINE HCL 50 MG/ML IJ SOLN: 12.5000 mg | Freq: Four times a day (QID) | INTRAMUSCULAR | Status: DC | PRN

## 2016-02-24 MED ORDER — LACTATED RINGERS IV SOLN
INTRAVENOUS | Status: DC | PRN
  Administered 2016-02-24: 07:00:00 via INTRAVENOUS

## 2016-02-24 MED ORDER — PROPOFOL 10 MG/ML IV BOLUS
INTRAVENOUS | Status: DC | PRN
  Administered 2016-02-24: 200 mg via INTRAVENOUS

## 2016-02-24 MED ORDER — PANTOPRAZOLE SODIUM 40 MG PO TBEC
40.0000 mg | DELAYED_RELEASE_TABLET | Freq: Every day | ORAL | Status: DC
  Administered 2016-02-24: 40 mg via ORAL
  Filled 2016-02-24: qty 1

## 2016-02-24 MED ORDER — ONDANSETRON HCL 4 MG/2ML IJ SOLN: 4.0000 mg | Freq: Four times a day (QID) | INTRAMUSCULAR | Status: DC | PRN

## 2016-02-24 MED ORDER — DEXAMETHASONE SODIUM PHOSPHATE 10 MG/ML IJ SOLN
INTRAMUSCULAR | Status: AC
  Filled 2016-02-24: qty 1

## 2016-02-24 MED ORDER — LORAZEPAM 2 MG/ML IJ SOLN: 0.5000 mg | Freq: Two times a day (BID) | INTRAMUSCULAR | Status: DC | PRN

## 2016-02-24 MED ORDER — 0.9 % SODIUM CHLORIDE (POUR BTL) OPTIME
TOPICAL | Status: DC | PRN
  Administered 2016-02-24: 1000 mL

## 2016-02-24 MED ORDER — SUCCINYLCHOLINE CHLORIDE 20 MG/ML IJ SOLN
INTRAMUSCULAR | Status: AC
  Filled 2016-02-24: qty 1

## 2016-02-24 MED ORDER — ONDANSETRON HCL 4 MG/2ML IJ SOLN
INTRAMUSCULAR | Status: AC
  Filled 2016-02-24: qty 2

## 2016-02-24 MED ORDER — LIDOCAINE 2% (20 MG/ML) 5 ML SYRINGE
INTRAMUSCULAR | Status: DC | PRN
  Administered 2016-02-24: 100 mg via INTRAVENOUS

## 2016-02-24 MED ORDER — BUPIVACAINE HCL 0.5 % IJ SOLN
INTRAMUSCULAR | Status: DC | PRN
  Administered 2016-02-24: 30 mL

## 2016-02-24 MED ORDER — MORPHINE SULFATE (PF) 10 MG/ML IV SOLN: 1.0000 mg | INTRAVENOUS | Status: DC | PRN

## 2016-02-24 MED ORDER — SUCCINYLCHOLINE CHLORIDE 200 MG/10ML IV SOSY
PREFILLED_SYRINGE | INTRAVENOUS | Status: DC | PRN
  Administered 2016-02-24: 100 mg via INTRAVENOUS

## 2016-02-24 MED ORDER — LORAZEPAM 0.5 MG PO TABS: 0.5000 mg | ORAL_TABLET | Freq: Two times a day (BID) | ORAL | Status: DC | PRN

## 2016-02-24 MED ORDER — SUGAMMADEX SODIUM 200 MG/2ML IV SOLN
INTRAVENOUS | Status: DC | PRN
  Administered 2016-02-24: 200 mg via INTRAVENOUS

## 2016-02-24 MED ORDER — SODIUM CHLORIDE 0.9 % IJ SOLN
INTRAMUSCULAR | Status: AC
  Filled 2016-02-24: qty 50

## 2016-02-24 MED ORDER — KETOROLAC TROMETHAMINE 30 MG/ML IJ SOLN: 30.0000 mg | Freq: Four times a day (QID) | INTRAMUSCULAR | Status: DC | PRN

## 2016-02-24 MED ORDER — ONDANSETRON 4 MG PO TBDP: 4.0000 mg | ORAL_TABLET | Freq: Four times a day (QID) | ORAL | Status: DC | PRN

## 2016-02-24 MED ORDER — KETOROLAC TROMETHAMINE 30 MG/ML IJ SOLN
30.0000 mg | Freq: Three times a day (TID) | INTRAMUSCULAR | Status: DC
  Administered 2016-02-24 – 2016-02-25 (×3): 30 mg via INTRAVENOUS
  Filled 2016-02-24 (×3): qty 1

## 2016-02-24 SURGICAL SUPPLY — 53 items
ADH SKN CLS APL DERMABOND .7 (GAUZE/BANDAGES/DRESSINGS) ×2
APL SKNCLS STERI-STRIP NONHPOA (GAUZE/BANDAGES/DRESSINGS) ×2
APPLIER CLIP LOGIC TI 5 (MISCELLANEOUS) IMPLANT
APR CLP MED LRG 33X5 (MISCELLANEOUS)
BENZOIN TINCTURE PRP APPL 2/3 (GAUZE/BANDAGES/DRESSINGS) ×2 IMPLANT
BINDER ABDOMINAL 12 ML 46-62 (SOFTGOODS) ×2 IMPLANT
BNDG ADH 5X4 AIR PERM ELC (GAUZE/BANDAGES/DRESSINGS) ×2
BNDG COHESIVE 4X5 WHT NS (GAUZE/BANDAGES/DRESSINGS) ×2 IMPLANT
CABLE HIGH FREQUENCY MONO STRZ (ELECTRODE) ×4 IMPLANT
CHLORAPREP W/TINT 26ML (MISCELLANEOUS) ×4 IMPLANT
CLOSURE WOUND 1/2 X4 (GAUZE/BANDAGES/DRESSINGS) ×1
COVER SURGICAL LIGHT HANDLE (MISCELLANEOUS) ×4 IMPLANT
DECANTER SPIKE VIAL GLASS SM (MISCELLANEOUS) ×4 IMPLANT
DERMABOND ADVANCED (GAUZE/BANDAGES/DRESSINGS) ×2
DERMABOND ADVANCED .7 DNX12 (GAUZE/BANDAGES/DRESSINGS) IMPLANT
DEVICE SECURE STRAP 25 ABSORB (INSTRUMENTS) ×2 IMPLANT
DEVICE TROCAR PUNCTURE CLOSURE (ENDOMECHANICALS) ×2 IMPLANT
DRAIN CHANNEL 19F RND (DRAIN) IMPLANT
DRAPE INCISE IOBAN 66X45 STRL (DRAPES) IMPLANT
DRAPE LG THREE QUARTER DISP (DRAPES) ×2 IMPLANT
DRAPE UTILITY XL STRL (DRAPES) IMPLANT
DRSG OPSITE POSTOP 4X6 (GAUZE/BANDAGES/DRESSINGS) ×2 IMPLANT
ELECT PENCIL ROCKER SW 15FT (MISCELLANEOUS) ×2 IMPLANT
ELECT REM PT RETURN 9FT ADLT (ELECTROSURGICAL) ×4
ELECTRODE REM PT RTRN 9FT ADLT (ELECTROSURGICAL) ×2 IMPLANT
EVACUATOR SILICONE 100CC (DRAIN) IMPLANT
GOWN STRL REUS W/TWL XL LVL3 (GOWN DISPOSABLE) ×12 IMPLANT
GRASPER SUT TROCAR 14GX15 (MISCELLANEOUS) IMPLANT
IRRIG SUCT STRYKERFLOW 2 WTIP (MISCELLANEOUS)
IRRIGATION SUCT STRKRFLW 2 WTP (MISCELLANEOUS) IMPLANT
KIT BASIN OR (CUSTOM PROCEDURE TRAY) ×4 IMPLANT
L-HOOK LAP DISP 36CM (ELECTROSURGICAL)
LHOOK LAP DISP 36CM (ELECTROSURGICAL) IMPLANT
MARKER SKIN DUAL TIP RULER LAB (MISCELLANEOUS) ×4 IMPLANT
MESH VENTRALIGHT ST 6IN CRC (Mesh General) ×2 IMPLANT
NDL SPNL 22GX3.5 QUINCKE BK (NEEDLE) ×2 IMPLANT
NEEDLE SPNL 22GX3.5 QUINCKE BK (NEEDLE) ×4 IMPLANT
SCISSORS LAP 5X35 DISP (ENDOMECHANICALS) ×4 IMPLANT
SHEARS HARMONIC ACE PLUS 36CM (ENDOMECHANICALS) ×2 IMPLANT
SLEEVE XCEL OPT CAN 5 100 (ENDOMECHANICALS) ×4 IMPLANT
STRIP CLOSURE SKIN 1/2X4 (GAUZE/BANDAGES/DRESSINGS) ×1 IMPLANT
SUT ETHILON 2 0 PS N (SUTURE) IMPLANT
SUT MNCRL AB 4-0 PS2 18 (SUTURE) ×4 IMPLANT
SUT NOVA NAB GS-21 0 18 T12 DT (SUTURE) ×6 IMPLANT
SUT VIC AB 3-0 SH 18 (SUTURE) ×2 IMPLANT
TOWEL OR 17X26 10 PK STRL BLUE (TOWEL DISPOSABLE) ×4 IMPLANT
TOWEL OR NON WOVEN STRL DISP B (DISPOSABLE) ×4 IMPLANT
TRAY FOLEY W/METER SILVER 16FR (SET/KITS/TRAYS/PACK) ×2 IMPLANT
TRAY LAPAROSCOPIC (CUSTOM PROCEDURE TRAY) ×4 IMPLANT
TROCAR BLADELESS OPT 5 100 (ENDOMECHANICALS) ×4 IMPLANT
TROCAR XCEL BLUNT TIP 100MML (ENDOMECHANICALS) IMPLANT
TROCAR XCEL NON-BLD 11X100MML (ENDOMECHANICALS) IMPLANT
TUBING INSUF HEATED (TUBING) ×2 IMPLANT

## 2016-02-24 NOTE — Assessment & Plan Note (Signed)
Back sprain: Acute back pain after doing yard work, no radiculopathy on clinical grounds. Patient is concerned b/c has a scheduled hernia repair surgery tomorrow and does not like to cancel. For that reason I will avoid NSAIDs and prednisone. Recommend rest, warm compress, Tylenol. Also Flexeril and Ultram ; recommend him to be extremely cautious as we don't know if he is going to become very somnolent w/ those medications (as he did w/ oxycodone). Additionally recommend to discuss above recommendations with the surgery office.

## 2016-02-24 NOTE — Anesthesia Procedure Notes (Signed)
Procedure Name: Intubation Date/Time: 02/24/2016 7:39 AM Performed by: Dione Booze Pre-anesthesia Checklist: Emergency Drugs available, Suction available, Patient identified and Patient being monitored Patient Re-evaluated:Patient Re-evaluated prior to inductionOxygen Delivery Method: Circle system utilized Preoxygenation: Pre-oxygenation with 100% oxygen Intubation Type: IV induction Laryngoscope Size: Mac and 4 Grade View: Grade I Tube type: Oral Tube size: 7.5 mm Number of attempts: 1 Airway Equipment and Method: Stylet Placement Confirmation: ETT inserted through vocal cords under direct vision,  positive ETCO2 and breath sounds checked- equal and bilateral Secured at: 22 cm Tube secured with: Tape Dental Injury: Teeth and Oropharynx as per pre-operative assessment

## 2016-02-24 NOTE — Anesthesia Postprocedure Evaluation (Signed)
Anesthesia Post Note  Patient: Ilean Skill  Procedure(s) Performed: Procedure(s) (LRB): LAPAROSCOPIC ASSISTED REPAIR OF SUPRAUMBILICAL  HERNIA WITH MESH (N/A) INSERTION OF MESH (N/A)  Patient location during evaluation: PACU Anesthesia Type: General Level of consciousness: awake and alert Pain management: pain level controlled Vital Signs Assessment: post-procedure vital signs reviewed and stable Respiratory status: spontaneous breathing, nonlabored ventilation, respiratory function stable and patient connected to nasal cannula oxygen Cardiovascular status: blood pressure returned to baseline and stable Postop Assessment: no signs of nausea or vomiting Anesthetic complications: no    Last Vitals:  Vitals:   02/24/16 1145 02/24/16 1200  BP: 129/76 125/78  Pulse: 77 82  Resp: 17 19  Temp:      Last Pain:  Vitals:   02/24/16 1200  TempSrc:   PainSc: 0-No pain                 Tony Friscia J

## 2016-02-24 NOTE — Anesthesia Preprocedure Evaluation (Signed)
Anesthesia Evaluation  Patient identified by MRN, date of birth, ID band Patient awake    Reviewed: Allergy & Precautions, NPO status , Patient's Chart, lab work & pertinent test results  History of Anesthesia Complications (+) PONV and history of anesthetic complications  Airway Mallampati: II  TM Distance: >3 FB Neck ROM: Full    Dental no notable dental hx.    Pulmonary former smoker,    Pulmonary exam normal breath sounds clear to auscultation       Cardiovascular negative cardio ROS Normal cardiovascular exam Rhythm:Regular Rate:Normal     Neuro/Psych Anxiety negative neurological ROS     GI/Hepatic negative GI ROS, Neg liver ROS,   Endo/Other  negative endocrine ROS  Renal/GU negative Renal ROS  negative genitourinary   Musculoskeletal negative musculoskeletal ROS (+)   Abdominal (+) + obese,   Peds negative pediatric ROS (+)  Hematology negative hematology ROS (+)   Anesthesia Other Findings   Reproductive/Obstetrics negative OB ROS                             Anesthesia Physical Anesthesia Plan  ASA: II  Anesthesia Plan: General   Post-op Pain Management:    Induction: Intravenous  Airway Management Planned: Oral ETT  Additional Equipment:   Intra-op Plan:   Post-operative Plan: Extubation in OR  Informed Consent: I have reviewed the patients History and Physical, chart, labs and discussed the procedure including the risks, benefits and alternatives for the proposed anesthesia with the patient or authorized representative who has indicated his/her understanding and acceptance.   Dental advisory given  Plan Discussed with: CRNA  Anesthesia Plan Comments:         Anesthesia Quick Evaluation

## 2016-02-24 NOTE — H&P (Signed)
Trevor Hurst is an 55 y.o. male.   Chief Complaint: here for surgery HPI: The patient is a 55 year old male who presents with an abdominal wall hernia. He comes in back in because of concerns of a hernia in his abdominal wall. I last saw him 4 years ago when we did multiple hernia repairs at the same time. He underwent laparoscopic repair of a recurrent right direct inguinal hernia with mesh, laparoscopic repair of left direct inguinal hernia with mesh as well as an open umbilical hernia repair with a round 6.4 cm piece of Ethicon proceed mesh. He states about a year ago he noticed a bulge above his umbilicus. It doesn't really cause a lot of pain. He can feel it or noticed it more after eating. He denies any skin changes. He denies any vomiting, diarrhea or constipation. He denies any smoking. He denies any chest pain, chest pressure, source of breath, dizziness or insertion common orthopnea, TIAs or amaurosis fugax.  Past Medical History:  Diagnosis Date  . Anxiety   . Complication of anesthesia   . Hyperlipidemia   . PONV (postoperative nausea and vomiting)     Past Surgical History:  Procedure Laterality Date  . HERNIA REPAIR  12 yrs ago   R   . INGUINAL HERNIA REPAIR  12/24/2011   Procedure: LAPAROSCOPIC BILATERAL INGUINAL HERNIA REPAIR;  Surgeon: Gayland Curry, MD,FACS;  Location: [facility];  Service: General;;  DR Redmond Pulling SPOKE WITH PT WIFE ABOUT REPAIRING THE UMBILICAL AND LEFT INGUINAL HERNIA BOTH WITH MESH .  ALL  RISKS WERE DISCUSSED WITH  HER OVER THE PHONE.  WIFE INSTRUCTED HIM TO PROCEED WITH REPAIRING THE LEFT INGUINAL AND UMBILICAL HERNIA   . UMBILICAL HERNIA REPAIR  12/24/2011   Procedure: HERNIA REPAIR UMBILICAL ADULT;  Surgeon: Gayland Curry, MD,FACS;  Location: [facility];  Service: General;  Laterality: N/A;  . warts  12/23/11   dermatologist froze them on back calfs bilaterally    Family History  Problem Relation Age of Onset  . Lung cancer Father     was a smoker  .  Transient ischemic attack Mother   . Depression Sister   . Diabetes Paternal Grandfather   . Heart disease Neg Hx   . Colon cancer Neg Hx   . Prostate cancer Neg Hx    Social History:  reports that he quit smoking about 4 years ago. He has a 10.00 pack-year smoking history. He has never used smokeless tobacco. He reports that he does not drink alcohol or use drugs.  Allergies:  Allergies  Allergen Reactions  . Oxycodone     With last surgery took one pain pill and slept for 14 hours and woke up in a sweat     Medications Prior to Admission  Medication Sig Dispense Refill  . b complex vitamins capsule Take 1 capsule by mouth daily with lunch.    . clobetasol ointment (TEMOVATE) [id number] % Apply 1 application topically every other day.    . cyclobenzaprine (FLEXERIL) 10 MG tablet Take 1 tablet (10 mg total) by mouth at bedtime as needed for muscle spasms. 21 tablet 0  . Digestive Enzymes (PAPAYA AND ENZYMES PO) Take 1 capsule by mouth 2 (two) times daily after a meal. Lunch and Dinner.    . fexofenadine (ALLEGRA) 180 MG tablet Take 180 mg by mouth daily.    Marland Kitchen ibuprofen (ADVIL,MOTRIN) 200 MG tablet Take 400 mg by mouth every 6 (six) hours as needed (for  pain.).    Marland Kitchen LORazepam (ATIVAN) 0.5 MG tablet Take 1 tablet (0.5 mg total) by mouth at bedtime as needed for sleep. (Patient taking differently: Take 0.5 mg by mouth at bedtime. ) 90 tablet 0  . nicotine polacrilex (NICORETTE) 4 MG gum Take 4 mg by mouth 4 (four) times daily as needed for smoking cessation.     . ondansetron (ZOFRAN) 4 MG tablet Take 1 tablet (4 mg total) by mouth every 8 (eight) hours as needed for nausea or vomiting. (Patient not taking: Reported on 02/23/2016) 20 tablet 0  . traMADol (ULTRAM) 50 MG tablet Take 0.5 tablets (25 mg total) by mouth every 6 (six) hours as needed. 30 tablet 0    No results found for this or any previous visit (from the past 48 hour(s)). No results found.  Review of Systems  Constitutional:  Negative for weight loss.  HENT: Negative for nosebleeds.   Eyes: Negative for blurred vision.  Respiratory: Negative for shortness of breath.   Cardiovascular: Negative for chest pain, palpitations, orthopnea and PND.       Denies DOE  Genitourinary: Negative for dysuria and hematuria.  Musculoskeletal: Positive for back pain (pulled back over weekend).  Skin: Negative for itching and rash.  Neurological: Negative for dizziness, focal weakness, seizures, loss of consciousness and headaches.       Denies TIAs, amaurosis fugax  Endo/Heme/Allergies: Does not bruise/bleed easily.  Psychiatric/Behavioral: The patient is not nervous/anxious.     Blood pressure (!) 142/81, pulse 88, temperature 97.8 F (36.6 C), temperature source Oral, resp. rate 18, height 6' (1.829 m), weight 102.5 kg (226 lb), SpO2 98 %. Physical Exam  Vitals reviewed. Constitutional: He is oriented to person, place, and time. He appears well-developed and well-nourished. No distress.  HENT:  Head: Normocephalic and atraumatic.  Right Ear: External ear normal.  Left Ear: External ear normal.  Eyes: Conjunctivae are normal. No scleral icterus.  Neck: Normal range of motion. Neck supple. No tracheal deviation present. No thyromegaly present.  Cardiovascular: Normal rate and normal heart sounds.   Respiratory: Effort normal and breath sounds normal. No stridor. No respiratory distress. He has no wheezes.  GI: Soft. He exhibits no distension. There is no tenderness. There is no rebound.  Supraumbilical hernia; old periumbilical incision  Musculoskeletal: He exhibits no edema or tenderness.  Lymphadenopathy:    He has no cervical adenopathy.  Neurological: He is alert and oriented to person, place, and time. He exhibits normal muscle tone.  Skin: Skin is warm and dry. No rash noted. He is not diaphoretic. No erythema. No pallor.  Psychiatric: He has a normal mood and affect. His behavior is normal. Judgment and thought  content normal.     Assessment/Plan Supraumbilical hernia  To OR for lap assisted repair.  All questions answered  Leighton Ruff. Redmond Pulling, MD, [place], Bariatric, & Minimally Invasive Surgery [facility], [place]   Gayland Curry, MD 02/24/2016, 7:25 AM

## 2016-02-24 NOTE — Transfer of Care (Signed)
Immediate Anesthesia Transfer of Care Note  Patient: Trevor Hurst  Procedure(s) Performed: Procedure(s): LAPAROSCOPIC ASSISTED REPAIR OF SUPRAUMBILICAL  HERNIA WITH MESH (N/A) INSERTION OF MESH (N/A)  Patient Location: PACU  Anesthesia Type:General  Level of Consciousness: awake and patient cooperative  Airway & Oxygen Therapy: Patient Spontanous Breathing and Patient connected to face mask oxygen  Post-op Assessment: Report given to RN and Post -op Vital signs reviewed and stable  Post vital signs: Reviewed and stable  Last Vitals:  Vitals:   02/24/16 0552  BP: (!) 142/81  Pulse: 88  Resp: 18  Temp: 36.6 C    Last Pain:  Vitals:   02/24/16 0632  TempSrc:   PainSc: 1          Complications: No apparent anesthesia complications

## 2016-02-24 NOTE — Brief Op Note (Signed)
02/24/2016  9:27 AM  PATIENT:  Trevor Hurst  55 y.o. male  PRE-OPERATIVE DIAGNOSIS:  RECURRENT VENTRAL HERNIA   POST-OPERATIVE DIAGNOSIS:  SUPRAUMBILICAL  HERNIA   PROCEDURE:  Procedure(s): LAPAROSCOPIC ASSISTED REPAIR OF SUPRAUMBILICAL  HERNIA WITH MESH (N/A) INSERTION OF MESH (N/A)  SURGEON:  Surgeon(s) and Role:    * Greer Pickerel, MD - Primary  Type of repair - primary suture with mesh underlay (IPOM)  (choices - primary suture, mesh, or component)  Name of mesh - Bard VentralightST  Size of mesh - round 6 inch  Mesh overlap ->5 cm  Placement of mesh - beneath fascia and into peritoneal cavity  (choices - beneath fascia and into peritoneal cavity, beneath fascia but external to peritoneal cavity, between the muscle and fascia, above or external to fascia)   PHYSICIAN ASSISTANT:   ASSISTANTS: none   ANESTHESIA:   general  EBL:  Total I/O In: 800 [I.V.:800] Out: 350 [Urine:300; Blood:50]  BLOOD ADMINISTERED:none  DRAINS: none   LOCAL MEDICATIONS USED:  MARCAINE     SPECIMEN:  Source of Specimen:  hernia sac  DISPOSITION OF SPECIMEN:  discarded  COUNTS:  YES  TOURNIQUET:  * No tourniquets in log *  DICTATION: .Other Dictation: Dictation Number [id number]  PLAN OF CARE: Admit for overnight observation  PATIENT DISPOSITION:  PACU - hemodynamically stable.   Delay start of Pharmacological VTE agent (>24hrs) due to surgical blood loss or risk of bleeding: no  Leighton Ruff. Redmond Pulling, MD, FACS General, Bariatric, & Minimally Invasive Surgery [facility], [place]

## 2016-02-25 DIAGNOSIS — K439 Ventral hernia without obstruction or gangrene: Secondary | ICD-10-CM | POA: Diagnosis not present

## 2016-02-25 LAB — CBC
HCT: 43.9 % (ref 39.0–52.0)
HEMOGLOBIN: 14.8 g/dL (ref 13.0–17.0)
MCH: 29.5 pg (ref 26.0–34.0)
MCHC: 33.7 g/dL (ref 30.0–36.0)
MCV: 87.6 fL (ref 78.0–100.0)
Platelets: 182 10*3/uL (ref 150–400)
RBC: 5.01 MIL/uL (ref 4.22–5.81)
RDW: 12.9 % (ref 11.5–15.5)
WBC: 12.5 10*3/uL — ABNORMAL HIGH (ref 4.0–10.5)

## 2016-02-25 LAB — BASIC METABOLIC PANEL
ANION GAP: 8 (ref 5–15)
BUN: 12 mg/dL (ref 6–20)
CALCIUM: 9 mg/dL (ref 8.9–10.3)
CO2: 28 mmol/L (ref 22–32)
Chloride: 102 mmol/L (ref 101–111)
Creatinine, Ser: 0.64 mg/dL (ref 0.61–1.24)
GLUCOSE: 144 mg/dL — AB (ref 65–99)
Potassium: 3.8 mmol/L (ref 3.5–5.1)
Sodium: 138 mmol/L (ref 135–145)

## 2016-02-25 LAB — MAGNESIUM: MAGNESIUM: 2 mg/dL (ref 1.7–2.4)

## 2016-02-25 MED ORDER — PROMETHAZINE HCL 12.5 MG PO TABS: 12.5000 mg | ORAL_TABLET | Freq: Four times a day (QID) | ORAL | 0 refills | Status: DC | PRN

## 2016-02-25 NOTE — Discharge Summary (Signed)
Physician Discharge Summary  Trevor Hurst [id number] DOB: 1960-06-16 DOA: 02/24/2016  PCP: Kathlene November, MD  Admit date: 02/24/2016 Discharge date: 02/25/2016  Recommendations for Outpatient Follow-up:   Follow-up Information    Gayland Curry, MD .   Specialty:  General Surgery Contact information: [address] [place] [address] [phone]          Discharge Diagnoses:  1. SUPRAUMBILICAL HERNIA   Surgical Procedure: LAPAROSCOPIC ASSISTED REPAIR OF SUPRAUMBILICAL HERNIA WITH MESH  Discharge Condition: GOOD Disposition: HOME  Diet recommendation: REGULAR  Filed Weights   02/24/16 [id number] 02/24/16 1334  Weight: 102.5 kg (226 lb) 102.5 kg (226 lb)    Hospital Course:  He came in for planned repair of supraumbilical hernia. He was kept overnight for pain control. He did well. He tolerated a diet. He was ambulating without difficulty. His pain was well controlled.   BP 116/70 (BP Location: Right Arm)   Pulse 83   Temp 98 F (36.7 C) (Oral)   Resp 16   Ht 6' (1.829 m)   Wt 102.5 kg (226 lb)   SpO2 99%   BMI 30.65 kg/m   Gen: alert, NAD, non-toxic appearing Pupils: equal, no scleral icterus Pulm: Lungs clear to auscultation, symmetric chest rise CV: regular rate and rhythm Abd: soft, mild expected tender, nondistended. No cellulitis. No incisional hernia. +binder Ext: no edema, no calf tenderness Skin: no rash, no jaundice  We discussed dc instructions.    Discharge Instructions  Discharge Instructions    Call MD for:    Complete by:  As directed    Temperature >101   Call MD for:  hives    Complete by:  As directed    Call MD for:  persistant dizziness or light-headedness    Complete by:  As directed    Call MD for:  persistant nausea and vomiting    Complete by:  As directed    Call MD for:  redness, tenderness, or signs of infection (pain, swelling, redness, odor or green/yellow discharge around incision site)    Complete by:  As  directed    Call MD for:  severe uncontrolled pain    Complete by:  As directed    Diet general    Complete by:  As directed    Discharge instructions    Complete by:  As directed    See [facility] discharge instructions   Increase activity slowly    Complete by:  As directed        Medication List    TAKE these medications   b complex vitamins capsule Take 1 capsule by mouth daily with lunch.   clobetasol ointment 0.05 % Commonly known as:  TEMOVATE Apply 1 application topically every other day.   cyclobenzaprine 10 MG tablet Commonly known as:  FLEXERIL Take 1 tablet (10 mg total) by mouth at bedtime as needed for muscle spasms.   fexofenadine 180 MG tablet Commonly known as:  ALLEGRA Take 180 mg by mouth daily.   ibuprofen 200 MG tablet Commonly known as:  ADVIL,MOTRIN Take 400 mg by mouth every 6 (six) hours as needed (for pain.).   LORazepam 0.5 MG tablet Commonly known as:  ATIVAN Take 1 tablet (0.5 mg total) by mouth at bedtime as needed for sleep. What changed:  when to take this   nicotine polacrilex 4 MG gum Commonly known as:  NICORETTE Take 4 mg by mouth 4 (four) times daily as needed for smoking cessation.  ondansetron 4 MG tablet Commonly known as:  ZOFRAN Take 1 tablet (4 mg total) by mouth every 8 (eight) hours as needed for nausea or vomiting.   PAPAYA AND ENZYMES PO Take 1 capsule by mouth 2 (two) times daily after a meal. Lunch and Dinner.   traMADol 50 MG tablet Commonly known as:  ULTRAM Take 0.5 tablets (25 mg total) by mouth every 6 (six) hours as needed.      Follow-up [place], MD .   Specialty:  General Surgery Contact information: [address] [place] [address] [phone]            The results of significant diagnostics from this hospitalization (including imaging, microbiology, ancillary and laboratory) are listed below for reference.    Significant Diagnostic Studies: No results  found.  Microbiology: No results found for this or any previous visit (from the past 240 hour(s)).   Labs: Basic Metabolic Panel:  Recent Labs Lab 02/25/16 0448  NA 138  K 3.8  CL 102  CO2 28  GLUCOSE 144*  BUN 12  CREATININE 0.64  CALCIUM 9.0  MG 2.0   Liver Function Tests: No results for input(s): AST, ALT, ALKPHOS, BILITOT, PROT, ALBUMIN in the last 168 hours. No results for input(s): LIPASE, AMYLASE in the last 168 hours. No results for input(s): AMMONIA in the last 168 hours. CBC:  Recent Labs Lab 02/19/16 0815 02/25/16 0448  WBC 6.2 12.5*  HGB 16.0 14.8  HCT 46.3 43.9  MCV 86.5 87.6  PLT 170 182   Cardiac Enzymes: No results for input(s): CKTOTAL, CKMB, CKMBINDEX, TROPONINI in the last 168 hours. BNP: BNP (last 3 results) No results for input(s): BNP in the last 8760 hours.  ProBNP (last 3 results) No results for input(s): PROBNP in the last 8760 hours.  CBG: No results for input(s): GLUCAP in the last 168 hours.  Active Problems:   S/P repair of ventral hernia   Time coordinating discharge: 15 minutes  Signed:  Gayland Curry, MD [facility], [place] [phone] 02/25/2016, 7:54 AM

## 2016-02-25 NOTE — Op Note (Signed)
NAMENORIS, HOHMAN NO.:  [id number]  MEDICAL RECORD NO.:  [id number]  LOCATION:  51                         FACILITY:  [facility]  PHYSICIAN:  Leighton Ruff. Redmond Pulling, MD, FACSDATE OF BIRTH:  [birth year]  DATE OF PROCEDURE:  02/24/2016 DATE OF DISCHARGE:                              OPERATIVE REPORT   PREOPERATIVE DIAGNOSIS:  Recurrent ventral hernia.  POSTOPERATIVE DIAGNOSIS:  Supraumbilical hernia.  PROCEDURE:  Laparoscopic-assisted repair of supraumbilical hernia with mesh.  SURGEON:  Leighton Ruff. Redmond Pulling, MD, FACS  ANESTHESIA:  General plus local consisting of 0.5% Marcaine.  SPECIMENS:  Hernia sac, which was discarded.  EBL:  Minimal, less than 25 mL.  FINDINGS:  The patient had an intact umbilical hernia repair with mesh in place.  He had a supraumbilical hernia containing a big plug of omentum, which was reduced.  The defect measured approximately 3.3 cm wide by 2 cm vertical.  This was primarily repaired with a mesh underlay.  A round piece of Bard VentraLight ST mesh was placed intra- abdominally and secured with 6 transfascial sutures.  INDICATIONS FOR PROCEDURE:  The patient is a pleasant gentleman who I operated on about 4 to 5 years ago who had bilateral inguinal hernias and a small umbilical hernia.  At that time, we did a laparoscopic repair of bilateral inguinal hernias with mesh and an open repair of his umbilical hernia with a round piece of 6.4 cm round mesh.  He came back in the office recently complaining of a bulge in his upper abdominal cavity.  He started to tell initially on exam if this was a recurrent umbilical hernia, if this was a separate supraumbilical hernia or epigastric hernia.  A CT scan was performed, which demonstrated a separate fascial defect in the supraumbilical position.  We discussed numerous ways to repair this.  He elected to have muscles primarily repaired with mesh underlay.  We discussed at length the risks  and benefits.  Please see my office notes for those multiple separate discussions.  DESCRIPTION OF PROCEDURE:  Preoperatively, the patient was given 1000 mg of oral Tylenol and gabapentin orally.  He was taken to the OR 2 at [facility], placed supine on the operating table.  Sequential compression devices were placed.  General endotracheal anesthesia was established.  His arms were tucked at the side with the appropriate padding.  His abdomen was prepped and draped in the usual standard surgical fashion with ChloraPrep.  A surgical time-out was performed. He received IV antibiotics prior to skin incision.  I elected to gain access to his abdomen using Optiview technique.  A small 0.5 cm incision was made in the left upper quadrant beneath the subcostal ridge and using a 0 degree 5-mm laparoscope with a 5-mm trocar advanced it through all layers of the abdominal wall and entered the abdominal cavity. Pneumoperitoneum was established to a patient pressure of 15 mmHg.  The laparoscope was advanced and abdominal cavity was surveilled.  He had an intact umbilical hernia repair.  The mesh was evident; however, he did have some omental adhesions to the umbilical mesh.  Above the umbilicus in the supraumbilical position, about an inch and  a half or so away, there was a large plug of omentum going up through the fascial defect. Another 5-mm trocar was placed in the left lateral abdominal wall under direct visualization.  Then using an atraumatic bowel grasper, I reduced the herniated omentum in its entirety.  This left in sort of an oval fascial defect.  I measured it laparoscopically, it measured about 3 cm wide by about 2 cm vertical.  At this point, I made a direct cutdown directly over the fascial defect in the supraumbilical position with a 10-blade, came immediately across the hernia sac.  There was a large plug of hernia sac and this was excised with electrocautery  and discarded.  This left a fascial defect.  Again, it was remeasured measuring a little bit over 3 cm wide by about 2 cm vertical.  At this point, I elected to use a round 6-inch piece of Bard VentraLight mesh. Stay sutures were placed around the mesh, equal distance from one another for a total of 6 sutures of 0-Novafil.  The mesh was marked and then placed into the abdominal cavity through the fascial defect.  I then closed the fascial defect transversely with 7 interrupted 0-Novafil sutures.  Pneumoperitoneum was reestablished.  The mesh was unrolled and laid out in the orientation that I had planned.  Then using a spinal needle to help identify the correct transfascial locations and then using a small nick in the skin, I used the Endo-Close suture passer to bring up the transfascial sutures in a sequential fashion in a corresponding abdominal wall location.  Once all 6 transfascial sutures have been brought up, the mesh was brought to the abdominal wall and snugly tightened.  There was little redundancy in the mesh.  It was nice and flat.  I then tied the transfascial sutures down.  I then secured the mesh also to the abdominal wall with Ethicon SecureStrap Tacker, placing tacks approximately 1 cm from one another along the edges of the mesh so that there was no gap where intraabdominal contents could get between the mesh and the abdominal wall.  Also placed sort of an inner crown of SecureStrap tacks inside the mesh to the abdominal wall.  I reinspected the Optiview entry site.  There was no evidence of bowel injury.  I placed local around the fascial closure and around the transfascial suture sites.  Pneumoperitoneum was released.  Trocars were removed.  The deep dermis was reapproximated in the small supraumbilical midline position.  I then closed that incision with 4-0 Monocryl in a subcuticular fashion.  The 2 trocar sites were also closed with 4-0 Monocryl.  We then applied  benzoin and Steri-Strips to the incision and trocar sites and transfascial suture sites.  Band-Aids were placed over the trocar sites followed by honeycomb dressing over the small midline incision and an abdominal binder.  The Foley catheter was removed.  The patient was extubated and taken to the recovery room in a stable condition.  All needle, instrument, and sponge counts were correct x2. There were no immediate complications.  The patient tolerated the procedure well.     Leighton Ruff. Redmond Pulling, MD, FACS     EMW/MEDQ  D:  02/24/2016  T:  02/25/2016  Job:  [id number]

## 2016-02-25 NOTE — Progress Notes (Signed)
Discharge instructions provided along with note for work.  Patient and wife state understanding

## 2016-02-25 NOTE — Discharge Instructions (Signed)
[facility], [place] [phone]  OPEN ABDOMINAL SURGERY: POST OP INSTRUCTIONS  Always review your discharge instruction sheet given to you by the facility where your surgery was performed.  IF YOU HAVE DISABILITY OR FAMILY LEAVE FORMS, YOU MUST BRING THEM TO THE OFFICE FOR PROCESSING.  PLEASE DO NOT GIVE THEM TO YOUR DOCTOR.  1. A prescription for pain medication may be given to you upon discharge.  Take your pain medication as prescribed, if needed.  If narcotic pain medicine is not needed, then you may take acetaminophen (Tylenol) &/or ibuprofen (Advil) as needed. 2. Take your usually prescribed medications unless otherwise directed. 3. If you need a refill on your pain medication, please contact your pharmacy. They will contact our office to request authorization.  Prescriptions will not be filled after 5pm or on week-ends. 4. You should follow a light diet the first few days after arrival home, such as soup and crackers, pudding, etc.unless your doctor has advised otherwise. A high-fiber, low fat diet can be resumed as tolerated.   Be sure to include lots of fluids daily. Most patients will experience some swelling and bruising on the chest and neck area.  Ice packs will help.  Swelling and bruising can take several days to resolve 5. Most patients will experience some swelling and bruising in the area of the incision. Ice pack will help. Swelling and bruising can take several days to resolve..  6. It is common to experience some constipation if taking pain medication after surgery.  Increasing fluid intake and taking a stool softener will usually help or prevent this problem from occurring.  A mild laxative (Milk of Magnesia or Miralax) should be taken according to package directions if there are no bowel movements after 48 hours. 7.  You may have steri-strips (small skin tapes) in place directly over the incision.  These strips should be left on the skin for 7-10 days.  If  your surgeon used skin glue on the incision, you may shower in 24 hours.  The glue will flake off over the next 2-3 weeks.  Any sutures or staples will be removed at the office during your follow-up visit. You may find that a light gauze bandage over your incision may keep your staples from being rubbed or pulled. You may shower and replace the bandage daily. 8. ACTIVITIES:  You may resume regular (light) daily activities beginning the next day--such as daily self-care, walking, climbing stairs--gradually increasing activities as tolerated.  You may have sexual intercourse when it is comfortable.  Refrain from any heavy lifting or straining until approved by your doctor. a. You may drive when you no longer are taking prescription pain medication, you can comfortably wear a seatbelt, and you can safely maneuver your car and apply brakes b. Return to Work: ___________________________________ 30. You should see your doctor in the office for a follow-up appointment approximately two weeks after your surgery.  Make sure that you call for this appointment within a day or two after you arrive home to insure a convenient appointment time. OTHER INSTRUCTIONS: DO NOT LIFT, PUSH, OR PULL ANYTHING GREATER THAN 15 LBS FOR 6 WEEKS _____________________________________________________________ _____________________________________________________________  WHEN TO CALL YOUR DOCTOR: 1. Fever over 101.0 2. Inability to urinate 3. Nausea and/or vomiting 4. Extreme swelling or bruising 5. Continued bleeding from incision. 6. Increased pain, redness, or drainage from the incision.  The clinic staff is available to answer your questions during regular business hours.  Please dont hesitate to call and ask to speak to one of the nurses if you have concerns.  For further questions, please visit [email or web address]

## 2016-03-11 ENCOUNTER — Other Ambulatory Visit: Payer: Self-pay | Admitting: Internal Medicine

## 2016-03-27 ENCOUNTER — Other Ambulatory Visit: Payer: Self-pay | Admitting: Internal Medicine

## 2016-03-29 NOTE — Telephone Encounter (Signed)
Rx printed, awaiting MD signature.  

## 2016-03-29 NOTE — Telephone Encounter (Signed)
Ok 90 and 1 RF 

## 2016-03-29 NOTE — Telephone Encounter (Signed)
Pt is requesting refill on Lorazepam.  Last OV: 02/23/2016 Last Fill: 12/17/2015 #90 and 0RF UDS: 05/07/2015 Low risk  Please advise.

## 2016-03-29 NOTE — Telephone Encounter (Signed)
Rx faxed to [facility].  

## 2016-06-10 ENCOUNTER — Encounter: Payer: Self-pay | Admitting: Internal Medicine

## 2016-07-02 ENCOUNTER — Telehealth: Payer: Self-pay | Admitting: Internal Medicine

## 2016-07-02 NOTE — Telephone Encounter (Signed)
Caller name: Trevor Hurst Relationship to patient: self Can be reached: [phone]  Reason for call: pt called for results of UDS. States that he came in and gave sample end of January/beginning of February. I do not see notes or labs with results. Pt would like call with results and asked if they can be release to MyChart.

## 2016-07-05 NOTE — Telephone Encounter (Signed)
Per notes, UDS sample was given 06/10/2016, chart reviewed, do not see where it has/was resulted. Will reach out to Amy.

## 2016-07-06 NOTE — Telephone Encounter (Signed)
UDS received, forwarded to PCP for review.

## 2016-07-07 NOTE — Telephone Encounter (Signed)
Spoke w/ Pt, apologized for delayed call, Pt very understanding. Informed him that UDS was deemed low risk, next check 1 year. Pt verbalized understanding.

## 2016-09-20 ENCOUNTER — Other Ambulatory Visit: Payer: Self-pay | Admitting: Internal Medicine

## 2016-09-21 NOTE — Telephone Encounter (Signed)
Pt is requesting refill on Lorazepam.  Last OV: 02/23/2016 Last Fill: 03/29/2016 #90 and 1RF UDS: 06/10/2016 Low risk  Please advise.

## 2016-09-21 NOTE — Telephone Encounter (Signed)
Rx faxed to [facility].  

## 2016-09-21 NOTE — Telephone Encounter (Signed)
Rx printed, awaiting MD signature.  

## 2016-09-21 NOTE — Telephone Encounter (Signed)
Okay #90, one refill

## 2016-12-20 ENCOUNTER — Encounter: Payer: Self-pay | Admitting: Internal Medicine

## 2016-12-20 ENCOUNTER — Ambulatory Visit (INDEPENDENT_AMBULATORY_CARE_PROVIDER_SITE_OTHER): Payer: 59 | Admitting: Internal Medicine

## 2016-12-20 VITALS — BP 124/78 | HR 78 | Temp 97.7°F | Resp 14 | Ht 72.0 in | Wt 220.2 lb

## 2016-12-20 DIAGNOSIS — Z0000 Encounter for general adult medical examination without abnormal findings: Secondary | ICD-10-CM | POA: Diagnosis not present

## 2016-12-20 LAB — COMPREHENSIVE METABOLIC PANEL
ALBUMIN: 3.9 g/dL (ref 3.5–5.2)
ALK PHOS: 78 U/L (ref 39–117)
ALT: 20 U/L (ref 0–53)
AST: 21 U/L (ref 0–37)
BILIRUBIN TOTAL: 0.5 mg/dL (ref 0.2–1.2)
BUN: 9 mg/dL (ref 6–23)
CO2: 29 mEq/L (ref 19–32)
Calcium: 9.2 mg/dL (ref 8.4–10.5)
Chloride: 104 mEq/L (ref 96–112)
Creatinine, Ser: 0.88 mg/dL (ref 0.40–1.50)
GFR: 95.57 mL/min (ref >60.00)
GLUCOSE: 99 mg/dL (ref 70–99)
POTASSIUM: 4 meq/L (ref 3.5–5.1)
Sodium: 140 mEq/L (ref 135–145)
TOTAL PROTEIN: 7.1 g/dL (ref 6.0–8.3)

## 2016-12-20 LAB — LIPID PANEL
CHOLESTEROL: 172 mg/dL (ref 0–200)
HDL: 39 mg/dL — AB (ref >39.00)
LDL Cholesterol: 105 mg/dL — ABNORMAL HIGH (ref 0–99)
NONHDL: 132.62
Total CHOL/HDL Ratio: 4
Triglycerides: 137 mg/dL (ref 0.0–149.0)
VLDL: 27.4 mg/dL (ref 0.0–40.0)

## 2016-12-20 LAB — TSH: TSH: 0.38 u[IU]/mL (ref 0.35–4.50)

## 2016-12-20 NOTE — Patient Instructions (Addendum)
GO TO THE LAB : Get the blood work     GO TO THE FRONT DESK Schedule your next appointment for a  physical exam in one year  Also schedule an lab appointment only for January 2019, will do a UDS done day

## 2016-12-20 NOTE — Assessment & Plan Note (Signed)
Anxiety insomnia: Well controlled with diazepam. UDS in few months Nicotine use: Does not smoke but uses nicotine gum on a regular basis. Will print a letter stating that. Hyperlipidemia: Diet control. Back sprain: See last OV, sxs resolved RTC one year

## 2016-12-20 NOTE — Progress Notes (Signed)
Pre visit review using our clinic review tool, if applicable. No additional management support is needed unless otherwise documented below in the visit note. 

## 2016-12-20 NOTE — Assessment & Plan Note (Addendum)
-  Td 2016 -CCS cscope 11-2014, next per GI -Prostate cancer screening, DRE-PSA 2017 wnl -Former smoker, + FH lung cancer, lung cancer screening discussed 2017, and today 12/20/2016 -Doing very well with diet and exercise. Encouraged to keep that trend during the wintertime -Labs : CMP, FLP, TSH

## 2016-12-20 NOTE — Progress Notes (Signed)
Subjective:    Patient ID: Trevor Hurst, male    DOB: [birth year], 56 y.o.   MRN: [id number]  DOS:  12/20/2016 Type of visit - description : cpx Interval history: Doing well, no concerns.    Review of Systems + Stress at work, handling okay with current meds Still using nicotine supplements  Other than above, a 14 point review of systems is negative      Past Medical History:  Diagnosis Date  . Anxiety   . Complication of anesthesia   . Hyperlipidemia   . PONV (postoperative nausea and vomiting)     Past Surgical History:  Procedure Laterality Date  . HERNIA REPAIR  12 yrs ago   R   . INGUINAL HERNIA REPAIR  12/24/2011   Procedure: LAPAROSCOPIC BILATERAL INGUINAL HERNIA REPAIR;  Surgeon: Gayland Curry, MD,FACS;  Location: [facility];  Service: General;;  DR Redmond Pulling SPOKE WITH PT WIFE ABOUT REPAIRING THE UMBILICAL AND LEFT INGUINAL HERNIA BOTH WITH MESH .  ALL  RISKS WERE DISCUSSED WITH  HER OVER THE PHONE.  WIFE INSTRUCTED HIM TO PROCEED WITH REPAIRING THE LEFT INGUINAL AND UMBILICAL HERNIA   . INSERTION OF MESH N/A 02/24/2016   Procedure: INSERTION OF MESH;  Surgeon: Greer Pickerel, MD;  Location: [facility];  Service: General;  Laterality: N/A;  . UMBILICAL HERNIA REPAIR  12/24/2011   Procedure: HERNIA REPAIR UMBILICAL ADULT;  Surgeon: Gayland Curry, MD,FACS;  Location: [facility];  Service: General;  Laterality: N/A;  . VENTRAL HERNIA REPAIR N/A 02/24/2016   Procedure: LAPAROSCOPIC ASSISTED REPAIR OF SUPRAUMBILICAL  HERNIA WITH MESH;  Surgeon: Greer Pickerel, MD;  Location: [facility];  Service: General;  Laterality: N/A;  . warts  12/23/11   dermatologist froze them on back calfs bilaterally   Family History  Problem Relation Age of Onset  . Lung cancer Father        was a smoker  . Transient ischemic attack Mother   . Depression Sister   . Diabetes Paternal Grandfather   . Heart disease Neg Hx   . Colon cancer Neg Hx   . Prostate cancer Neg Hx     Social History   Social History    . Marital status: Married    Spouse name: [name]  . Number of children: 2  . Years of education: N/A   Occupational History  . [employer]   Social History Main Topics  . Smoking status: Former Smoker    Packs/day: 1.00    Years: 10.00    Quit date: 04/30/2011  . Smokeless tobacco: Never Used     Comment: smoked 1979-2012 , up to 1 ppd. Off for total of 4 years.Smoked total 29 years.  . Alcohol use No  . Drug use: No  . Sexual activity: Not on file   Other Topics Concern  . Not on file   Social History Narrative   Lives w/ wife;   one in college senior, one graduate       Allergies as of 12/20/2016      Reactions   Oxycodone    With last surgery took one pain pill and slept for 14 hours and woke up in a sweat       Medication List       Accurate as of 12/20/16 12:51 PM. Always use your most recent med list.          b complex vitamins capsule Take 1 capsule by mouth daily with  lunch.   clobetasol ointment 0.05 % Commonly known as:  TEMOVATE Apply 1 application topically every other day.   cyclobenzaprine 10 MG tablet Commonly known as:  FLEXERIL Take 1 tablet (10 mg total) by mouth at bedtime as needed for muscle spasms.   fexofenadine 180 MG tablet Commonly known as:  ALLEGRA Take 180 mg by mouth daily.   ibuprofen 200 MG tablet Commonly known as:  ADVIL,MOTRIN Take 400 mg by mouth every 6 (six) hours as needed (for pain.).   LORazepam 0.5 MG tablet Commonly known as:  ATIVAN Take 1 tablet (0.5 mg total) by mouth at bedtime as needed for sleep.   nicotine polacrilex 4 MG gum Commonly known as:  NICORETTE Take 4 mg by mouth 4 (four) times daily as needed for smoking cessation.   PAPAYA AND ENZYMES PO Take 1 capsule by mouth 2 (two) times daily after a meal. Lunch and Dinner.   Vitamin D-3 1000 units Caps Take 1 capsule by mouth daily.          Objective:   Physical Exam BP 124/78 (BP Location: Left Arm, Patient Position: Sitting, Cuff  Size: Normal)   Pulse 78   Temp 97.7 F (36.5 C) (Oral)   Resp 14   Ht 6' (1.829 m)   Wt 220 lb 4 oz (99.9 kg)   SpO2 98%   BMI 29.87 kg/m   General:   Well developed, well nourished . NAD.  Neck: No  thyromegaly  HEENT:  Normocephalic . Face symmetric, atraumatic Lungs:  CTA B Normal respiratory effort, no intercostal retractions, no accessory muscle use. Heart: RRR,  no murmur.  No pretibial edema bilaterally  Abdomen:  Not distended, soft, non-tender. No rebound or rigidity.   Skin: Exposed areas without rash. Not pale. Not jaundice Neurologic:  alert & oriented X3.  Speech normal, gait appropriate for age and unassisted Strength symmetric and appropriate for age.  Psych: Cognition and judgment appear intact.  Cooperative with normal attention span and concentration.  Behavior appropriate. No anxious or depressed appearing.    Assessment & Plan:   Assessment  Anxiety, insomnia -- diazepam qhs, also uses nicotine gum at night Nicotine use (no tobacco) Hyperlipidemia Psoriasis (per derm) Pre-ca skin lesion R knee   PLAN: Anxiety insomnia: Well controlled with diazepam. UDS in few months Nicotine use: Does not smoke but uses nicotine gum on a regular basis. Will print a letter stating that. Hyperlipidemia: Diet control. Back sprain: See last OV, sxs resolved RTC one year

## 2016-12-24 ENCOUNTER — Telehealth: Payer: Self-pay | Admitting: Internal Medicine

## 2016-12-24 NOTE — Telephone Encounter (Signed)
Pt dropped off document to be filled out by provider (Healthy track clinical data sheet [facility]- 1 page) Pt would like to be called when document ready Tel [phone]. Document put at front office tray.

## 2016-12-28 NOTE — Telephone Encounter (Signed)
Completed as much of form as possible; forwarded to provider for signature upon return to office. Called and spoke with patient to get waist circumference and advised pt that provider is out of the office this week, understood & agreed/SLS 08/28

## 2017-01-04 NOTE — Telephone Encounter (Signed)
Spoke w/ Pt- informed that form has been completed and placed at front desk for pick up at his convenience. Copy of form sent for scanning.

## 2017-01-18 ENCOUNTER — Other Ambulatory Visit: Payer: Self-pay | Admitting: Internal Medicine

## 2017-03-13 ENCOUNTER — Other Ambulatory Visit: Payer: Self-pay | Admitting: Internal Medicine

## 2017-03-14 NOTE — Telephone Encounter (Signed)
Okay number 90 tablets, 1 refill

## 2017-03-14 NOTE — Telephone Encounter (Signed)
Pt is requesting refill on lorazepam 0.5mg .   Last OV: 12/20/2016 Last Fill: 09/21/2016 #90 and 1RF UDS: 06/10/2016 Low risk  NCCR printed; no issues noted.   Please advise.

## 2017-03-14 NOTE — Telephone Encounter (Signed)
Rx printed, awaiting MD signature.  

## 2017-03-14 NOTE — Telephone Encounter (Signed)
Rx faxed to [facility].  

## 2017-05-27 ENCOUNTER — Other Ambulatory Visit: Payer: Self-pay

## 2017-05-27 ENCOUNTER — Other Ambulatory Visit (INDEPENDENT_AMBULATORY_CARE_PROVIDER_SITE_OTHER): Payer: 59

## 2017-05-27 DIAGNOSIS — F419 Anxiety disorder, unspecified: Secondary | ICD-10-CM

## 2017-05-27 DIAGNOSIS — Z79899 Other long term (current) drug therapy: Secondary | ICD-10-CM

## 2017-06-01 LAB — PAIN MGMT, PROFILE 8 W/CONF, U
6 Acetylmorphine: NEGATIVE ng/mL (ref <10)
ALPHAHYDROXYALPRAZOLAM: NEGATIVE ng/mL (ref <25)
ALPHAHYDROXYTRIAZOLAM: NEGATIVE ng/mL (ref <50)
AMINOCLONAZEPAM: NEGATIVE ng/mL (ref <25)
AMPHETAMINES: NEGATIVE ng/mL (ref <500)
Alcohol Metabolites: NEGATIVE ng/mL (ref <500)
Alphahydroxymidazolam: NEGATIVE ng/mL (ref <50)
BUPRENORPHINE, URINE: NEGATIVE ng/mL (ref <5)
Benzodiazepines: POSITIVE ng/mL — AB (ref <100)
Cocaine Metabolite: NEGATIVE ng/mL (ref <150)
Creatinine: 87.8 mg/dL
Hydroxyethylflurazepam: NEGATIVE ng/mL (ref <50)
Lorazepam: 192 ng/mL — ABNORMAL HIGH (ref <50)
MDMA: NEGATIVE ng/mL (ref <500)
Marijuana Metabolite: NEGATIVE ng/mL (ref <20)
Nordiazepam: NEGATIVE ng/mL (ref <50)
OXAZEPAM: NEGATIVE ng/mL (ref <50)
OXYCODONE: NEGATIVE ng/mL (ref <100)
Opiates: NEGATIVE ng/mL (ref <100)
Oxidant: NEGATIVE ug/mL (ref <200)
TEMAZEPAM: NEGATIVE ng/mL (ref <50)
pH: 7.05 (ref 4.5–9.0)

## 2017-09-14 ENCOUNTER — Other Ambulatory Visit: Payer: Self-pay | Admitting: Internal Medicine

## 2017-09-14 ENCOUNTER — Ambulatory Visit: Payer: Self-pay

## 2017-09-14 ENCOUNTER — Encounter: Payer: Self-pay | Admitting: Nurse Practitioner

## 2017-09-14 ENCOUNTER — Ambulatory Visit (INDEPENDENT_AMBULATORY_CARE_PROVIDER_SITE_OTHER): Payer: 59 | Admitting: Nurse Practitioner

## 2017-09-14 VITALS — BP 136/90 | HR 100 | Temp 98.6°F | Ht 72.0 in | Wt 217.6 lb

## 2017-09-14 DIAGNOSIS — J069 Acute upper respiratory infection, unspecified: Secondary | ICD-10-CM | POA: Diagnosis not present

## 2017-09-14 MED ORDER — FLUTICASONE PROPIONATE 50 MCG/ACT NA SUSP: 2.0000 | Freq: Every day | NASAL | 0 refills | Status: DC

## 2017-09-14 MED ORDER — AZITHROMYCIN 250 MG PO TABS: 250.0000 mg | ORAL_TABLET | Freq: Every day | ORAL | 0 refills | Status: DC

## 2017-09-14 MED ORDER — CHLORPHEN-PE-ACETAMINOPHEN 4-10-325 MG PO TABS: 1.0000 | ORAL_TABLET | Freq: Two times a day (BID) | ORAL | 0 refills | Status: DC

## 2017-09-14 MED ORDER — METHYLPREDNISOLONE ACETATE 40 MG/ML IJ SUSP
40.0000 mg | Freq: Once | INTRAMUSCULAR | Status: AC
  Administered 2017-09-14: 40 mg via INTRAMUSCULAR

## 2017-09-14 MED ORDER — GUAIFENESIN-DM 100-10 MG/5ML PO SYRP
5.0000 mL | ORAL_SOLUTION | ORAL | 0 refills | Status: DC | PRN
Start: 2017-09-14 — End: 2017-11-18

## 2017-09-14 MED ORDER — BENZONATATE 100 MG PO CAPS: 100.0000 mg | ORAL_CAPSULE | Freq: Three times a day (TID) | ORAL | 0 refills | Status: DC | PRN

## 2017-09-14 NOTE — Telephone Encounter (Signed)
fyi

## 2017-09-14 NOTE — Telephone Encounter (Signed)
Pt. C/o cough, non-productive, since Sunday. Reports wheezing, which is concerning to pt. No fever. Has used Mucinex and Allegra. Appointment made for today. Denies chest pain or shortness of breath.  Answer Assessment - Initial Assessment Questions 1. ONSET: "When did the cough begin?"      sUNDAY 2. SEVERITY: "How bad is the cough today?"      Moderate 3. RESPIRATORY DISTRESS: "Describe your breathing."      No distress 4. FEVER: "Do you have a fever?" If so, ask: "What is your temperature, how was it measured, and when did it start?"     No 5. HEMOPTYSIS: "Are you coughing up any blood?" If so ask: "How much?" (flecks, streaks, tablespoons, etc.)     No 6. TREATMENT: "What have you done so far to treat the cough?" (e.g., meds, fluids, humidifier)     Mucinex 7. CARDIAC HISTORY: "Do you have any history of heart disease?" (e.g., heart attack, congestive heart failure)      No  8. LUNG HISTORY: "Do you have any history of lung disease?"  (e.g., pulmonary embolus, asthma, emphysema)     No 9. PE RISK FACTORS: "Do you have a history of blood clots?" (or: recent major surgery, recent prolonged travel, bedridden )     No 10. OTHER SYMPTOMS: "Do you have any other symptoms? (e.g., runny nose, wheezing, chest pain)       Wheezing and headache, cough 11. PREGNANCY: "Is there any chance you are pregnant?" "When was your last menstrual period?"       No 12. TRAVEL: "Have you traveled out of the country in the last month?" (e.g., travel history, exposures)       No  Protocols used: COUGH - ACUTE NON-PRODUCTIVE-A-AH

## 2017-09-14 NOTE — Patient Instructions (Signed)
Start azithromycin if no improvement by Friday.  Push oral hydration.

## 2017-09-14 NOTE — Progress Notes (Signed)
Subjective:  Patient ID: Trevor Hurst, male    DOB: [birth year]  Age: 57 y.o. MRN: [id number]  CC: Nasal Congestion (Saturday woke up with congestions, starting wheezing, unable to sleep last 2 nights tried OTC but not feeling any better. Feels pressure in his head and behind his eyes.)   URI   This is a new problem. The current episode started in the past 7 days. The problem has been gradually worsening. There has been no fever. Associated symptoms include congestion, coughing, ear pain, headaches, a plugged ear sensation, rhinorrhea, sinus pain, a sore throat, swollen glands and wheezing. He has tried decongestant, increased fluids and acetaminophen for the symptoms. The treatment provided mild relief.   Outpatient Medications Prior to Visit  Medication Sig Dispense Refill  . b complex vitamins capsule Take 1 capsule by mouth daily with lunch.    . Cholecalciferol (VITAMIN D-3) 1000 units CAPS Take 1 capsule by mouth daily.    . clobetasol ointment (TEMOVATE) 9.73 % Apply 1 application topically every other day.    . fexofenadine (ALLEGRA) 180 MG tablet Take 180 mg by mouth daily.    Marland Kitchen ibuprofen (ADVIL,MOTRIN) 200 MG tablet Take 400 mg by mouth every 6 (six) hours as needed (for pain.).    Marland Kitchen LORazepam (ATIVAN) 0.5 MG tablet Take 1 tablet (0.5 mg total) at bedtime as needed by mouth for sleep. 90 tablet 1  . nicotine polacrilex (NICORETTE) 4 MG gum Take 4 mg by mouth 4 (four) times daily as needed for smoking cessation.     . cyclobenzaprine (FLEXERIL) 10 MG tablet Take 1 tablet (10 mg total) by mouth at bedtime as needed for muscle spasms. (Patient not taking: Reported on 09/14/2017) 21 tablet 0  . Digestive Enzymes (PAPAYA AND ENZYMES PO) Take 1 capsule by mouth 2 (two) times daily after a meal. Lunch and Dinner.     No facility-administered medications prior to visit.     ROS See HPI  Objective:  BP 136/90 (BP Location: Left Arm, Patient Position: Sitting, Cuff Size: Large)   Pulse  100   Temp 98.6 F (37 C) (Oral)   Ht 6' (1.829 m)   Wt 217 lb 9.6 oz (98.7 kg)   SpO2 94%   BMI 29.51 kg/m   BP Readings from Last 3 Encounters:  09/14/17 136/90  12/20/16 124/78  02/25/16 116/70    Wt Readings from Last 3 Encounters:  09/14/17 217 lb 9.6 oz (98.7 kg)  12/20/16 220 lb 4 oz (99.9 kg)  02/24/16 226 lb (102.5 kg)    Physical Exam  Constitutional: He is oriented to person, place, and time. No distress.  HENT:  Right Ear: External ear and ear canal normal. Tympanic membrane is not injected. A middle ear effusion is present.  Left Ear: Ear canal normal. Tympanic membrane is not injected. A middle ear effusion is present.  Nose: Mucosal edema and rhinorrhea present. Right sinus exhibits maxillary sinus tenderness and frontal sinus tenderness. Left sinus exhibits maxillary sinus tenderness and frontal sinus tenderness.  Mouth/Throat: Uvula is midline. Posterior oropharyngeal erythema present. No oropharyngeal exudate.  Eyes: No scleral icterus.  Neck: Normal range of motion. Neck supple.  Cardiovascular: Normal rate and regular rhythm.  Pulmonary/Chest: Effort normal and breath sounds normal. He has no wheezes.  Lymphadenopathy:    He has cervical adenopathy.  Neurological: He is alert and oriented to person, place, and time.  Vitals reviewed.   Lab Results  Component Value Date   WBC  12.5 (H) 02/25/2016   HGB 14.8 02/25/2016   HCT 43.9 02/25/2016   PLT 182 02/25/2016   GLUCOSE 99 12/20/2016   CHOL 172 12/20/2016   TRIG 137.0 12/20/2016   HDL 39.00 (L) 12/20/2016   LDLDIRECT 169.3 01/19/2013   LDLCALC 105 (H) 12/20/2016   ALT 20 12/20/2016   AST 21 12/20/2016   NA 140 12/20/2016   K 4.0 12/20/2016   CL 104 12/20/2016   CREATININE 0.88 12/20/2016   BUN 9 12/20/2016   CO2 29 12/20/2016   TSH 0.38 12/20/2016   PSA 2.53 12/17/2015    No results found.  Assessment & Plan:   Barnet was seen today for nasal congestion.  Diagnoses and all orders  for this visit:  Acute URI -     methylPREDNISolone acetate (DEPO-MEDROL) injection 40 mg -     Chlorphen-PE-Acetaminophen 4-10-325 MG TABS; Take 1 tablet by mouth 2 (two) times daily. -     fluticasone (FLONASE) 50 MCG/ACT nasal spray; Place 2 sprays into both nostrils daily. -     benzonatate (TESSALON) 100 MG capsule; Take 1 capsule (100 mg total) by mouth 3 (three) times daily as needed for cough. -     guaiFENesin-dextromethorphan (ROBITUSSIN DM) 100-10 MG/5ML syrup; Take 5 mLs by mouth every 4 (four) hours as needed for cough. -     azithromycin (ZITHROMAX Z-PAK) 250 MG tablet; Take 1 tablet (250 mg total) by mouth daily. Take 2tabs on first day, then 1tab once a day till complete   I am having Trevor Hurst start on Chlorphen-PE-Acetaminophen, fluticasone, benzonatate, guaiFENesin-dextromethorphan, and azithromycin. I am also having him maintain his Digestive Enzymes (PAPAYA AND ENZYMES PO), nicotine polacrilex, b complex vitamins, clobetasol ointment, ibuprofen, fexofenadine, cyclobenzaprine, Vitamin D-3, and LORazepam. We administered methylPREDNISolone acetate.  Meds ordered this encounter  Medications  . methylPREDNISolone acetate (DEPO-MEDROL) injection 40 mg  . Chlorphen-PE-Acetaminophen 4-10-325 MG TABS    Sig: Take 1 tablet by mouth 2 (two) times daily.    Dispense:  6 tablet    Refill:  0    Order Specific Question:   Supervising Provider    Answer:   Lucille Passy [[id number]]  . fluticasone (FLONASE) 50 MCG/ACT nasal spray    Sig: Place 2 sprays into both nostrils daily.    Dispense:  16 g    Refill:  0    Order Specific Question:   Supervising Provider    Answer:   Lucille Passy [[id number]]  . benzonatate (TESSALON) 100 MG capsule    Sig: Take 1 capsule (100 mg total) by mouth 3 (three) times daily as needed for cough.    Dispense:  20 capsule    Refill:  0    Order Specific Question:   Supervising Provider    Answer:   Lucille Passy [[id number]]  . guaiFENesin-dextromethorphan  (ROBITUSSIN DM) 100-10 MG/5ML syrup    Sig: Take 5 mLs by mouth every 4 (four) hours as needed for cough.    Dispense:  118 mL    Refill:  0    Order Specific Question:   Supervising Provider    Answer:   Lucille Passy [[id number]]  . azithromycin (ZITHROMAX Z-PAK) 250 MG tablet    Sig: Take 1 tablet (250 mg total) by mouth daily. Take 2tabs on first day, then 1tab once a day till complete    Dispense:  6 tablet    Refill:  0    Order Specific Question:   Supervising  Provider    Answer:   Lucille Passy [[id number]]    Follow-up: No follow-ups on file.  Wilfred Lacy, NP

## 2017-09-15 NOTE — Telephone Encounter (Signed)
Pt is requesting refill on lorazepam. Trevor Hurst Pt.   Last OV: 12/20/2016 Last Fill: 03/14/2017 #90 and 1RF (Pt sig: 1 tab qhs prn) UDS: 05/27/2017 Low risk  NCCR printed- no discrepancies noted- sent for scanning  Please advise.

## 2017-09-15 NOTE — Telephone Encounter (Addendum)
I sent in prescription of lorazepam.  I wrote it for 30-day prescription with 2 refills.  I saw Dr. Larose Kells wrote for 90 days with 1 refill.  By law understand I can only write 30-day supply at any one time with the refills.  Only gave 3-month supply as I do not know Dr. Larose Kells protocol on when patient should follow-up.

## 2017-09-21 ENCOUNTER — Telehealth: Payer: Self-pay | Admitting: Internal Medicine

## 2017-09-21 NOTE — Telephone Encounter (Signed)
Please advise, last OV 12/2016.

## 2017-09-21 NOTE — Telephone Encounter (Signed)
Copied from [facility] #[id number]. Topic: Quick Communication - Rx Refill/Question >> Sep 21, 2017  2:33 PM Marin Olp L wrote: Reason for CRM: Patient would like nausea medicine called in for a trip he's going on next Tuesday 28th. He forgot what it was called but says it's similar to finigrin. He uses [id number] - [place], [place] - [address] [place] [address] Phone: [phone] Fax: [phone]

## 2017-09-21 NOTE — Telephone Encounter (Signed)
Patient is requesting a Rx for Zofran sent to pharmacy  He states he is going on a Business trip to [place] next Tuesday He has no symptoms now.He would like to have some on hand.   He states Dr Myrlene Broker Zofran for him for similar circumstances in August of 2017. And it helped him when he developed some nausea on a   Similar trip   [facility]  [place]

## 2017-09-22 MED ORDER — ONDANSETRON HCL 4 MG PO TABS: 4.0000 mg | ORAL_TABLET | Freq: Three times a day (TID) | ORAL | 0 refills | Status: DC | PRN

## 2017-09-22 NOTE — Telephone Encounter (Signed)
LMOM informing Pt that Rx has been sent and of recommendations.

## 2017-09-22 NOTE — Telephone Encounter (Signed)
Advised patient, Rx sent. If he has severe symptoms needs to see a local MD

## 2017-10-06 ENCOUNTER — Other Ambulatory Visit: Payer: Self-pay | Admitting: Nurse Practitioner

## 2017-10-06 DIAGNOSIS — J069 Acute upper respiratory infection, unspecified: Secondary | ICD-10-CM

## 2017-10-15 ENCOUNTER — Other Ambulatory Visit: Payer: Self-pay | Admitting: Family Medicine

## 2017-10-15 DIAGNOSIS — J069 Acute upper respiratory infection, unspecified: Secondary | ICD-10-CM

## 2017-10-17 NOTE — Telephone Encounter (Signed)
JP-Plz see refill req/thx dmf 

## 2017-11-18 ENCOUNTER — Ambulatory Visit (INDEPENDENT_AMBULATORY_CARE_PROVIDER_SITE_OTHER): Payer: 59 | Admitting: Internal Medicine

## 2017-11-18 ENCOUNTER — Encounter: Payer: Self-pay | Admitting: Internal Medicine

## 2017-11-18 VITALS — BP 122/68 | HR 68 | Temp 98.2°F | Resp 16 | Ht 72.0 in | Wt 214.2 lb

## 2017-11-18 DIAGNOSIS — S233XXA Sprain of ligaments of thoracic spine, initial encounter: Secondary | ICD-10-CM | POA: Diagnosis not present

## 2017-11-18 MED ORDER — CYCLOBENZAPRINE HCL 10 MG PO TABS: 10.0000 mg | ORAL_TABLET | Freq: Every evening | ORAL | 1 refills | Status: DC | PRN

## 2017-11-18 NOTE — Patient Instructions (Signed)
Warm compress  Flexeril at bed time  IBUPROFEN (Advil or Motrin) 200 mg 1-2 tablets every 8 hours as needed for pain.  Always take it with food because may cause gastritis and ulcers.  If you notice nausea, stomach pain, change in the color of stools --->  Stop the medicine and let us know  Call if no better in few days

## 2017-11-18 NOTE — Progress Notes (Signed)
Pre visit review using our clinic review tool, if applicable. No additional management support is needed unless otherwise documented below in the visit note. 

## 2017-11-18 NOTE — Assessment & Plan Note (Signed)
Sprain, upper back: Refill flexeril which is working for him, continue OTC Motrin, GI precautions discussed.  Use a heating pad.  Call if not improving in the next few days.

## 2017-11-18 NOTE — Progress Notes (Signed)
Subjective:    Patient ID: Trevor Hurst, male    DOB: 1960/07/16, 57 y.o.   MRN: [id number]  DOS:  11/18/2017 Type of visit - description : acute Interval history: Few days ago, was working in his garage, move a storage container to a top shelf and immediately developed pain at both sides of the distal thoracic spine.  No radiation. Since then he is taking Flexeril at night and ibuprofen and that helps the pain. The pain of the thoracic spine is improving, he has developed some pain at the left upper back.  Worse when he moves his neck.  Review of Systems Denies neck pain per se. No upper or lower extremity paresthesias or numbness.  Past Medical History:  Diagnosis Date  . Anxiety   . Complication of anesthesia   . Hyperlipidemia   . PONV (postoperative nausea and vomiting)     Past Surgical History:  Procedure Laterality Date  . HERNIA REPAIR  12 yrs ago   R   . INGUINAL HERNIA REPAIR  12/24/2011   Procedure: LAPAROSCOPIC BILATERAL INGUINAL HERNIA REPAIR;  Surgeon: Gayland Curry, MD,FACS;  Location: [facility];  Service: General;;  DR Redmond Pulling SPOKE WITH PT WIFE ABOUT REPAIRING THE UMBILICAL AND LEFT INGUINAL HERNIA BOTH WITH MESH .  ALL  RISKS WERE DISCUSSED WITH  HER OVER THE PHONE.  WIFE INSTRUCTED HIM TO PROCEED WITH REPAIRING THE LEFT INGUINAL AND UMBILICAL HERNIA   . INSERTION OF MESH N/A 02/24/2016   Procedure: INSERTION OF MESH;  Surgeon: Greer Pickerel, MD;  Location: [facility];  Service: General;  Laterality: N/A;  . UMBILICAL HERNIA REPAIR  12/24/2011   Procedure: HERNIA REPAIR UMBILICAL ADULT;  Surgeon: Gayland Curry, MD,FACS;  Location: [facility];  Service: General;  Laterality: N/A;  . VENTRAL HERNIA REPAIR N/A 02/24/2016   Procedure: LAPAROSCOPIC ASSISTED REPAIR OF SUPRAUMBILICAL  HERNIA WITH MESH;  Surgeon: Greer Pickerel, MD;  Location: [facility];  Service: General;  Laterality: N/A;  . warts  12/23/11   dermatologist froze them on back calfs bilaterally    Social History    Socioeconomic History  . Marital status: Married    Spouse name: [name]  . Number of children: 2  . Years of education: Not on file  . Highest education level: Not on file  Occupational History  . Occupation: Building control surveyor: [employer]  . Financial resource strain: Not on file  . Food insecurity:    Worry: Not on file    Inability: Not on file  . Transportation needs:    Medical: Not on file    Non-medical: Not on file  Tobacco Use  . Smoking status: Former Smoker    Packs/day: 1.00    Years: 10.00    Pack years: 10.00    Last attempt to quit: 04/30/2011    Years since quitting: 6.5  . Smokeless tobacco: Never Used  . Tobacco comment: smoked 1979-2012 , up to 1 ppd. Off for total of 4 years.Smoked total 29 years.  Substance and Sexual Activity  . Alcohol use: No    Alcohol/week: 0.6 oz    Types: 1 Standard drinks or equivalent per week  . Drug use: No  . Sexual activity: Not on file  Lifestyle  . Physical activity:    Days per week: Not on file    Minutes per session: Not on file  . Stress: Not on file  Relationships  . Social connections:  Talks on phone: Not on file    Gets together: Not on file    Attends religious service: Not on file    Active member of club or organization: Not on file    Attends meetings of clubs or organizations: Not on file    Relationship status: Not on file  . Intimate partner violence:    Fear of current or ex partner: Not on file    Emotionally abused: Not on file    Physically abused: Not on file    Forced sexual activity: Not on file  Other Topics Concern  . Not on file  Social History Narrative   Lives w/ wife;   one in college senior, one graduate       Allergies as of 11/18/2017      Reactions   Oxycodone    With last surgery took one pain pill and slept for 14 hours and woke up in a sweat       Medication List        Accurate as of 11/18/17  4:17 PM. Always use your most recent med list.           b complex vitamins capsule Take 1 capsule by mouth daily with lunch.   clobetasol ointment 0.05 % Commonly known as:  TEMOVATE Apply 1 application topically every other day.   cyclobenzaprine 10 MG tablet Commonly known as:  FLEXERIL Take 1 tablet (10 mg total) by mouth at bedtime as needed for muscle spasms.   fexofenadine 180 MG tablet Commonly known as:  ALLEGRA Take 180 mg by mouth daily.   fluticasone 50 MCG/ACT nasal spray Commonly known as:  FLONASE Place 2 sprays into both nostrils daily as needed for allergies or rhinitis.   ibuprofen 200 MG tablet Commonly known as:  ADVIL,MOTRIN Take 400 mg by mouth every 6 (six) hours as needed (for pain.).   LORazepam 0.5 MG tablet Commonly known as:  ATIVAN TAKE 1 TABLET BY MOUTH AT BEDTIME AS NEEDED FOR SLEEP   nicotine polacrilex 4 MG gum Commonly known as:  NICORETTE Take 4 mg by mouth 4 (four) times daily as needed for smoking cessation.   Vitamin D-3 1000 units Caps Take 1 capsule by mouth daily.          Objective:   Physical Exam BP 122/68 (BP Location: Left Arm, Patient Position: Sitting, Cuff Size: Small)   Pulse 68   Temp 98.2 F (36.8 C) (Oral)   Resp 16   Ht 6' (1.829 m)   Wt 214 lb 4 oz (97.2 kg)   SpO2 98%   BMI 29.06 kg/m  General:   Well developed, NAD, see BMI.  HEENT:  Normocephalic . Face symmetric, atraumatic MSK No TTP at the cervical or thoracic spine.  Neck range of motion normal Skin: Not pale. Not jaundice Neurologic:  alert & oriented X3.  Speech normal, gait appropriate for age and unassisted.  Motor and DTR symmetric.  No antalgic posture Psych--  Cognition and judgment appear intact.  Cooperative with normal attention span and concentration.  Behavior appropriate. No anxious or depressed appearing.      Assessment & Plan:    Assessment  Anxiety, insomnia -- diazepam qhs, also uses nicotine gum at night Nicotine use (no tobacco) Hyperlipidemia Psoriasis (per  derm) Pre-ca skin lesion R knee   PLAN: Sprain, upper back: Refill flexeril which is working for him, continue OTC Motrin, GI precautions discussed.  Use a heating pad.  Call if  not improving in the next few days.

## 2017-12-15 ENCOUNTER — Other Ambulatory Visit: Payer: Self-pay | Admitting: Medical

## 2017-12-16 NOTE — Telephone Encounter (Signed)
Pt is requesting refill on lorazepam. Paz Pt.   Last OV: 11/18/2017 Last Fill: 09/15/2017 #30 and 2RF UDS: 05/27/2017 Low risk

## 2017-12-22 ENCOUNTER — Ambulatory Visit (INDEPENDENT_AMBULATORY_CARE_PROVIDER_SITE_OTHER): Payer: 59 | Admitting: Internal Medicine

## 2017-12-22 ENCOUNTER — Encounter: Payer: Self-pay | Admitting: Internal Medicine

## 2017-12-22 VITALS — BP 125/80 | HR 81 | Temp 97.8°F | Resp 16 | Ht 72.0 in | Wt 208.6 lb

## 2017-12-22 DIAGNOSIS — Z0000 Encounter for general adult medical examination without abnormal findings: Secondary | ICD-10-CM

## 2017-12-22 LAB — LIPID PANEL
Cholesterol: 184 mg/dL (ref 0–200)
HDL: 40.3 mg/dL (ref >39.00)
LDL Cholesterol: 121 mg/dL — ABNORMAL HIGH (ref 0–99)
NonHDL: 143.99
Total CHOL/HDL Ratio: 5
Triglycerides: 115 mg/dL (ref 0.0–149.0)
VLDL: 23 mg/dL (ref 0.0–40.0)

## 2017-12-22 LAB — CBC WITH DIFFERENTIAL/PLATELET
Basophils Absolute: 0 10*3/uL (ref 0.0–0.1)
Basophils Relative: 0.4 % (ref 0.0–3.0)
Eosinophils Absolute: 0.1 10*3/uL (ref 0.0–0.7)
Eosinophils Relative: 1.4 % (ref 0.0–5.0)
HCT: 46.9 % (ref 39.0–52.0)
Hemoglobin: 16.5 g/dL (ref 13.0–17.0)
Lymphocytes Relative: 20.9 % (ref 12.0–46.0)
Lymphs Abs: 1.2 10*3/uL (ref 0.7–4.0)
MCHC: 35.1 g/dL (ref 30.0–36.0)
MCV: 86.2 fl (ref 78.0–100.0)
Monocytes Absolute: 0.4 10*3/uL (ref 0.1–1.0)
Monocytes Relative: 7.8 % (ref 3.0–12.0)
Neutro Abs: 3.9 10*3/uL (ref 1.4–7.7)
Neutrophils Relative %: 69.5 % (ref 43.0–77.0)
Platelets: 171 10*3/uL (ref 150.0–400.0)
RBC: 5.44 Mil/uL (ref 4.22–5.81)
RDW: 13.3 % (ref 11.5–15.5)
WBC: 5.6 10*3/uL (ref 4.0–10.5)

## 2017-12-22 LAB — COMPREHENSIVE METABOLIC PANEL
ALBUMIN: 4.4 g/dL (ref 3.5–5.2)
ALT: 19 U/L (ref 0–53)
AST: 19 U/L (ref 0–37)
Alkaline Phosphatase: 83 U/L (ref 39–117)
BUN: 13 mg/dL (ref 6–23)
CO2: 29 mEq/L (ref 19–32)
CREATININE: 0.83 mg/dL (ref 0.40–1.50)
Calcium: 9.6 mg/dL (ref 8.4–10.5)
Chloride: 103 mEq/L (ref 96–112)
GFR: 101.87 mL/min (ref >60.00)
GLUCOSE: 90 mg/dL (ref 70–99)
Potassium: 3.8 mEq/L (ref 3.5–5.1)
SODIUM: 140 meq/L (ref 135–145)
Total Bilirubin: 0.7 mg/dL (ref 0.2–1.2)
Total Protein: 7 g/dL (ref 6.0–8.3)

## 2017-12-22 LAB — PSA: PSA: 2.2 ng/mL (ref 0.10–4.00)

## 2017-12-22 MED ORDER — SILDENAFIL CITRATE 20 MG PO TABS: 60.0000 mg | ORAL_TABLET | Freq: Every evening | ORAL | 3 refills | Status: DC | PRN

## 2017-12-22 NOTE — Assessment & Plan Note (Addendum)
-  Td 2016 -CCS cscope 11-2014, next per GI -Prostate cancer screening, DRE wnl today, check a PSA -Former smoker, + FH lung cancer, lung cancer screening discussed 2017, and  12/20/2016 -Diet-exercise: Doing great, exercises almost daily, continue to lose weight. -Labs: CMP, FLP, CBC, PSA

## 2017-12-22 NOTE — Assessment & Plan Note (Signed)
Anxiety, insomnia: Stress increase at work, patient is counseled, recommend nonpharmacological measures and come back in 3 months, SSRIs?Marland Kitchen  Refill Ativan as needed. Nicotine use:  Letter for his insurance provided ED: New problem, thinks he is related to anxiety, no CV symptoms, trial with Viagra. We will bring a form to be filled for work once results are available (no charge) RTC 3 months

## 2017-12-22 NOTE — Patient Instructions (Addendum)
GO TO THE LAB : Get the blood work     GO TO THE FRONT DESK Schedule your next appointment for a  checkup in 3 months  

## 2017-12-22 NOTE — Progress Notes (Signed)
Subjective:    Patient ID: Trevor Hurst, male    DOB: October 05, 1960, 57 y.o.   MRN: [id number]  DOS:  12/22/2017 Type of visit - description : cpx Interval history: Here for CPX, has some concerns  BP Readings from Last 3 Encounters:  12/22/17 125/80  11/18/17 122/68  09/14/17 136/90   Wt Readings from Last 3 Encounters:  12/22/17 208 lb 9.6 oz (94.6 kg)  11/18/17 214 lb 4 oz (97.2 kg)  09/14/17 217 lb 9.6 oz (98.7 kg)     Review of Systems Increased stress at work Denies depression per se Has noted some difficulty keeping erections, libido normal. He remains active, goes to the gym almost daily, working on his diet and losing weight.  Other than above, a 14 point review of systems is negative    Past Medical History:  Diagnosis Date  . Anxiety   . Complication of anesthesia   . Hyperlipidemia   . PONV (postoperative nausea and vomiting)     Past Surgical History:  Procedure Laterality Date  . HERNIA REPAIR  12 yrs ago   R   . INGUINAL HERNIA REPAIR  12/24/2011   Procedure: LAPAROSCOPIC BILATERAL INGUINAL HERNIA REPAIR;  Surgeon: Gayland Curry, MD,FACS;  Location: [facility];  Service: General;;  DR Redmond Pulling SPOKE WITH PT WIFE ABOUT REPAIRING THE UMBILICAL AND LEFT INGUINAL HERNIA BOTH WITH MESH .  ALL  RISKS WERE DISCUSSED WITH  HER OVER THE PHONE.  WIFE INSTRUCTED HIM TO PROCEED WITH REPAIRING THE LEFT INGUINAL AND UMBILICAL HERNIA   . INSERTION OF MESH N/A 02/24/2016   Procedure: INSERTION OF MESH;  Surgeon: Greer Pickerel, MD;  Location: [facility];  Service: General;  Laterality: N/A;  . UMBILICAL HERNIA REPAIR  12/24/2011   Procedure: HERNIA REPAIR UMBILICAL ADULT;  Surgeon: Gayland Curry, MD,FACS;  Location: [facility];  Service: General;  Laterality: N/A;  . VENTRAL HERNIA REPAIR N/A 02/24/2016   Procedure: LAPAROSCOPIC ASSISTED REPAIR OF SUPRAUMBILICAL  HERNIA WITH MESH;  Surgeon: Greer Pickerel, MD;  Location: [facility];  Service: General;  Laterality: N/A;  . warts  12/23/11   dermatologist froze them on back calfs bilaterally    Social History   Socioeconomic History  . Marital status: Married    Spouse name: [name]  . Number of children: 2  . Years of education: Not on file  . Highest education level: Not on file  Occupational History  . Occupation: Building control surveyor: [employer]  . Financial resource strain: Not on file  . Food insecurity:    Worry: Not on file    Inability: Not on file  . Transportation needs:    Medical: Not on file    Non-medical: Not on file  Tobacco Use  . Smoking status: Former Smoker    Packs/day: 1.00    Years: 10.00    Pack years: 10.00    Last attempt to quit: 04/30/2011    Years since quitting: 6.6  . Smokeless tobacco: Never Used  . Tobacco comment: smoked 1979-2012 , up to 1 ppd. Off for total of 4 years.Smoked total 29 years.  Substance and Sexual Activity  . Alcohol use: No    Alcohol/week: 1.0 standard drinks    Types: 1 Standard drinks or equivalent per week  . Drug use: No  . Sexual activity: Not on file  Lifestyle  . Physical activity:    Days per week: Not on file  Minutes per session: Not on file  . Stress: Not on file  Relationships  . Social connections:    Talks on phone: Not on file    Gets together: Not on file    Attends religious service: Not on file    Active member of club or organization: Not on file    Attends meetings of clubs or organizations: Not on file    Relationship status: Not on file  . Intimate partner violence:    Fear of current or ex partner: Not on file    Emotionally abused: Not on file    Physically abused: Not on file    Forced sexual activity: Not on file  Other Topics Concern  . Not on file  Social History Narrative   Lives w/ wife;   2 children graduated     Family History  Problem Relation Age of Onset  . Lung cancer Father        was a smoker  . Transient ischemic attack Mother   . Depression Sister   . Diabetes Paternal Grandfather   .  Heart disease Neg Hx   . Colon cancer Neg Hx   . Prostate cancer Neg Hx      Allergies as of 12/22/2017      Reactions   Oxycodone    With last surgery took one pain pill and slept for 14 hours and woke up in a sweat       Medication List        Accurate as of 12/22/17  2:51 PM. Always use your most recent med list.          b complex vitamins capsule Take 1 capsule by mouth daily with lunch.   clobetasol ointment 0.05 % Commonly known as:  TEMOVATE Apply 1 application topically every other day.   cyclobenzaprine 10 MG tablet Commonly known as:  FLEXERIL Take 1 tablet (10 mg total) by mouth at bedtime as needed for muscle spasms.   fexofenadine 180 MG tablet Commonly known as:  ALLEGRA Take 180 mg by mouth daily.   ibuprofen 200 MG tablet Commonly known as:  ADVIL,MOTRIN Take 400 mg by mouth every 6 (six) hours as needed (for pain.).   LORazepam 0.5 MG tablet Commonly known as:  ATIVAN TAKE 1 TABLET BY MOUTH EVERY DAY AT BEDTIME AS NEEDED FOR SLEEP   nicotine polacrilex 4 MG gum Commonly known as:  NICORETTE Take 4 mg by mouth 4 (four) times daily as needed for smoking cessation.   sildenafil 20 MG tablet Commonly known as:  REVATIO Take 3-4 tablets (60-80 mg total) by mouth at bedtime as needed.   Vitamin D-3 1000 units Caps Take 1 capsule by mouth daily.          Objective:   Physical Exam BP 125/80 (BP Location: Right Arm, Patient Position: Sitting, Cuff Size: Normal)   Pulse 81   Temp 97.8 F (36.6 C) (Oral)   Resp 16   Ht 6' (1.829 m)   Wt 208 lb 9.6 oz (94.6 kg)   SpO2 98%   BMI 28.29 kg/m  General: Well developed, NAD, see BMI.  Neck: No  thyromegaly  HEENT:  Normocephalic . Face symmetric, atraumatic Lungs:  CTA B Normal respiratory effort, no intercostal retractions, no accessory muscle use. Heart: RRR,  no murmur.  No pretibial edema bilaterally  Abdomen:  Not distended, soft, non-tender. No rebound or rigidity.    Rectal: External abnormalities: none. Normal sphincter tone. No rectal  masses or tenderness.  Brown stools Prostate: Prostate gland firm and smooth, no enlargement, nodularity, tenderness, mass, asymmetry or induration Skin: Exposed areas without rash. Not pale. Not jaundice Neurologic:  alert & oriented X3.  Speech normal, gait appropriate for age and unassisted Strength symmetric and appropriate for age.  Psych: Cognition and judgment appear intact.  Cooperative with normal attention span and concentration.  Behavior appropriate. No anxious or depressed appearing.     Assessment & Plan:    Assessment  Anxiety, insomnia -- diazepam qhs, also uses nicotine gum at night Nicotine use (no tobacco) Hyperlipidemia Psoriasis (per derm) Pre-ca skin lesion R knee   PLAN: Anxiety, insomnia: Stress increase at work, patient is counseled, recommend nonpharmacological measures and come back in 3 months, SSRIs?Marland Kitchen  Refill Ativan as needed. Nicotine use:  Letter for his insurance provided ED: New problem, thinks he is related to anxiety, no CV symptoms, trial with Viagra. We will bring a form to be filled for work once results are available (no charge) RTC 3 months

## 2017-12-28 ENCOUNTER — Telehealth: Payer: Self-pay | Admitting: Internal Medicine

## 2017-12-28 NOTE — Telephone Encounter (Signed)
Patient came in and dropped off a form for his insurance to be completed by Dr. Larose Kells. Patient would like to pick this form up when it is completed and he will send the forms himself.

## 2018-01-04 ENCOUNTER — Telehealth: Payer: Self-pay | Admitting: *Deleted

## 2018-01-04 NOTE — Telephone Encounter (Signed)
Received completed Wellness Form from provider to contact pt and inform him that it is ready for p/u; also to call and LM for me if he would like me to fax to the phone number on bottom of the page before he p/u; will make copy for scan/SLS 09/04

## 2018-01-10 ENCOUNTER — Other Ambulatory Visit: Payer: Self-pay | Admitting: Family

## 2018-01-11 NOTE — Telephone Encounter (Signed)
Sent!

## 2018-01-11 NOTE — Telephone Encounter (Signed)
Last alprazolam RX: 12/16/17 #30 Last OV: 12/22/17 Next OV: 03/15/18 UDS: 05/27/17, low risk CSC: 05/27/17 CSR: No discrepancies identified

## 2018-02-24 ENCOUNTER — Encounter: Payer: Self-pay | Admitting: Family Medicine

## 2018-02-24 ENCOUNTER — Ambulatory Visit (INDEPENDENT_AMBULATORY_CARE_PROVIDER_SITE_OTHER): Payer: 59 | Admitting: Family Medicine

## 2018-02-24 VITALS — BP 126/80 | HR 52 | Temp 97.9°F | Ht 72.0 in | Wt 212.1 lb

## 2018-02-24 DIAGNOSIS — J0100 Acute maxillary sinusitis, unspecified: Secondary | ICD-10-CM | POA: Diagnosis not present

## 2018-02-24 MED ORDER — AMOXICILLIN 500 MG PO CAPS: 500.0000 mg | ORAL_CAPSULE | Freq: Three times a day (TID) | ORAL | 0 refills | Status: DC

## 2018-02-24 NOTE — Progress Notes (Signed)
Subjective:  Patient ID: Trevor Hurst, male    DOB: 15-Mar-1961  Age: 57 y.o. MRN: [id number]  CC: Sinus Problem   HPI Trevor Hurst presents for evaluation of 6 to 7-day history of evolving nasal congestion, postnasal drip with teeth pain in the left upper side of his mouth.  At this time patient denies rhinorrhea, fever, cough or nausea and vomiting.  He is having some pressure type pain in his right maxillary sinus area.  Denies a history of bruxism.  He has no asthma history.  He takes an Human resources officer daily.  Outpatient Medications Prior to Visit  Medication Sig Dispense Refill  . b complex vitamins capsule Take 1 capsule by mouth daily with lunch.    . Cholecalciferol (VITAMIN D-3) 1000 units CAPS Take 1 capsule by mouth daily.    . clobetasol ointment (TEMOVATE) 7.34 % Apply 1 application topically every other day.    . cyclobenzaprine (FLEXERIL) 10 MG tablet Take 1 tablet (10 mg total) by mouth at bedtime as needed for muscle spasms. 30 tablet 1  . fexofenadine (ALLEGRA) 180 MG tablet Take 180 mg by mouth daily.    Marland Kitchen ibuprofen (ADVIL,MOTRIN) 200 MG tablet Take 400 mg by mouth every 6 (six) hours as needed (for pain.).    Marland Kitchen LORazepam (ATIVAN) 0.5 MG tablet TAKE 1 TABLET BY MOUTH AT BEDTIME AS NEEDED FOR SLEEP 30 tablet 1  . nicotine polacrilex (NICORETTE) 4 MG gum Take 4 mg by mouth 4 (four) times daily as needed for smoking cessation.     . sildenafil (REVATIO) 20 MG tablet Take 3-4 tablets (60-80 mg total) by mouth at bedtime as needed. 30 tablet 3   No facility-administered medications prior to visit.     ROS Review of Systems  Constitutional: Negative for chills, diaphoresis, fatigue, fever and unexpected weight change.  HENT: Positive for congestion, dental problem, postnasal drip, sinus pressure and sinus pain. Negative for facial swelling, rhinorrhea and sore throat.   Eyes: Negative for photophobia and visual disturbance.  Respiratory: Negative.   Cardiovascular: Negative.    Gastrointestinal: Negative.   Musculoskeletal: Negative for arthralgias and myalgias.  Skin: Negative for pallor and rash.  Neurological: Negative for weakness and headaches.  Hematological: Does not bruise/bleed easily.  Psychiatric/Behavioral: Negative.     Objective:  BP 126/80   Pulse (!) 52   Temp 97.9 F (36.6 C) (Oral)   Ht 6' (1.829 m)   Wt 212 lb 2 oz (96.2 kg)   SpO2 99%   BMI 28.77 kg/m   BP Readings from Last 3 Encounters:  02/24/18 126/80  12/22/17 125/80  11/18/17 122/68    Wt Readings from Last 3 Encounters:  02/24/18 212 lb 2 oz (96.2 kg)  12/22/17 208 lb 9.6 oz (94.6 kg)  11/18/17 214 lb 4 oz (97.2 kg)    Physical Exam  Constitutional: He is oriented to person, place, and time. He appears well-developed and well-nourished. No distress.  HENT:  Head: Normocephalic and atraumatic.  Right Ear: External ear normal.  Left Ear: External ear normal.  Mouth/Throat: Oropharynx is clear and moist. No oropharyngeal exudate.  Eyes: Pupils are equal, round, and reactive to light. Conjunctivae and EOM are normal. Right eye exhibits no discharge. Left eye exhibits no discharge. No scleral icterus.  Neck: Neck supple. No JVD present. No tracheal deviation present. No thyromegaly present.  Cardiovascular: Normal rate, regular rhythm and normal heart sounds.  Pulmonary/Chest: Effort normal and breath sounds normal.  Neurological: He  is alert and oriented to person, place, and time.  Skin: Skin is warm and dry. He is not diaphoretic.  Psychiatric: He has a normal mood and affect. His behavior is normal.    Lab Results  Component Value Date   WBC 5.6 12/22/2017   HGB 16.5 12/22/2017   HCT 46.9 12/22/2017   PLT 171.0 12/22/2017   GLUCOSE 90 12/22/2017   CHOL 184 12/22/2017   TRIG 115.0 12/22/2017   HDL 40.30 12/22/2017   LDLDIRECT 169.3 01/19/2013   LDLCALC 121 (H) 12/22/2017   ALT 19 12/22/2017   AST 19 12/22/2017   NA 140 12/22/2017   K 3.8 12/22/2017    CL 103 12/22/2017   CREATININE 0.83 12/22/2017   BUN 13 12/22/2017   CO2 29 12/22/2017   TSH 0.38 12/20/2016   PSA 2.20 12/22/2017    No results found.  Assessment & Plan:   Mostyn was seen today for sinus problem.  Diagnoses and all orders for this visit:  Acute non-recurrent maxillary sinusitis -     amoxicillin (AMOXIL) 500 MG capsule; Take 1 capsule (500 mg total) by mouth 3 (three) times daily.   I am having Trevor Hurst start on amoxicillin. I am also having him maintain his nicotine polacrilex, b complex vitamins, clobetasol ointment, ibuprofen, fexofenadine, Vitamin D-3, cyclobenzaprine, sildenafil, and LORazepam.  Meds ordered this encounter  Medications  . amoxicillin (AMOXIL) 500 MG capsule    Sig: Take 1 capsule (500 mg total) by mouth 3 (three) times daily.    Dispense:  30 capsule    Refill:  0     Follow-up: Return in about 1 week (around 03/03/2018), or if symptoms worsen or fail to improve.  Libby Maw, MD

## 2018-02-24 NOTE — Patient Instructions (Signed)
Sinusitis, Adult Sinusitis is soreness and inflammation of your sinuses. Sinuses are hollow spaces in the bones around your face. Your sinuses are located:  Around your eyes.  In the middle of your forehead.  Behind your nose.  In your cheekbones.  Your sinuses and nasal passages are lined with a stringy fluid (mucus). Mucus normally drains out of your sinuses. When your nasal tissues become inflamed or swollen, the mucus can become trapped or blocked so air cannot flow through your sinuses. This allows bacteria, viruses, and funguses to grow, which leads to infection. Sinusitis can develop quickly and last for 7?10 days (acute) or for more than 12 weeks (chronic). Sinusitis often develops after a cold. What are the causes? This condition is caused by anything that creates swelling in the sinuses or stops mucus from draining, including:  Allergies.  Asthma.  Bacterial or viral infection.  Abnormally shaped bones between the nasal passages.  Nasal growths that contain mucus (nasal polyps).  Narrow sinus openings.  Pollutants, such as chemicals or irritants in the air.  A foreign object stuck in the nose.  A fungal infection. This is rare.  What increases the risk? The following factors may make you more likely to develop this condition:  Having allergies or asthma.  Having had a recent cold or respiratory tract infection.  Having structural deformities or blockages in your nose or sinuses.  Having a weak immune system.  Doing a lot of swimming or diving.  Overusing nasal sprays.  Smoking.  What are the signs or symptoms? The main symptoms of this condition are pain and a feeling of pressure around the affected sinuses. Other symptoms include:  Upper toothache.  Earache.  Headache.  Bad breath.  Decreased sense of smell and taste.  A cough that may get worse at night.  Fatigue.  Fever.  Thick drainage from your nose. The drainage is often green and  it may contain pus (purulent).  Stuffy nose or congestion.  Postnasal drip. This is when extra mucus collects in the throat or back of the nose.  Swelling and warmth over the affected sinuses.  Sore throat.  Sensitivity to light.  How is this diagnosed? This condition is diagnosed based on symptoms, a medical history, and a physical exam. To find out if your condition is acute or chronic, your health care provider may:  Look in your nose for signs of nasal polyps.  Tap over the affected sinus to check for signs of infection.  View the inside of your sinuses using an imaging device that has a light attached (endoscope).  If your health care provider suspects that you have chronic sinusitis, you may also:  Be tested for allergies.  Have a sample of mucus taken from your nose (nasal culture) and checked for bacteria.  Have a mucus sample examined to see if your sinusitis is related to an allergy.  If your sinusitis does not respond to treatment and it lasts longer than 8 weeks, you may have an MRI or CT scan to check your sinuses. These scans also help to determine how severe your infection is. In rare cases, a bone biopsy may be done to rule out more serious types of fungal sinus disease. How is this treated? Treatment for sinusitis depends on the cause and whether your condition is chronic or acute. If a virus is causing your sinusitis, your symptoms will go away on their own within 10 days. You may be given medicines to relieve your symptoms,   including:  Topical nasal decongestants. They shrink swollen nasal passages and let mucus drain from your sinuses.  Antihistamines. These drugs block inflammation that is triggered by allergies. This can help to ease swelling in your nose and sinuses.  Topical nasal corticosteroids. These are nasal sprays that ease inflammation and swelling in your nose and sinuses.  Nasal saline washes. These rinses can help to get rid of thick mucus in  your nose.  If your condition is caused by bacteria, you will be given an antibiotic medicine. If your condition is caused by a fungus, you will be given an antifungal medicine. Surgery may be needed to correct underlying conditions, such as narrow nasal passages. Surgery may also be needed to remove polyps. Follow these instructions at home: Medicines  Take, use, or apply over-the-counter and prescription medicines only as told by your health care provider. These may include nasal sprays.  If you were prescribed an antibiotic medicine, take it as told by your health care provider. Do not stop taking the antibiotic even if you start to feel better. Hydrate and Humidify  Drink enough water to keep your urine clear or pale yellow. Staying hydrated will help to thin your mucus.  Use a cool mist humidifier to keep the humidity level in your home above 50%.  Inhale steam for 10-15 minutes, 3-4 times a day or as told by your health care provider. You can do this in the bathroom while a hot shower is running.  Limit your exposure to cool or dry air. Rest  Rest as much as possible.  Sleep with your head raised (elevated).  Make sure to get enough sleep each night. General instructions  Apply a warm, moist washcloth to your face 3-4 times a day or as told by your health care provider. This will help with discomfort.  Wash your hands often with soap and water to reduce your exposure to viruses and other germs. If soap and water are not available, use hand sanitizer.  Do not smoke. Avoid being around people who are smoking (secondhand smoke).  Keep all follow-up visits as told by your health care provider. This is important. Contact a health care provider if:  You have a fever.  Your symptoms get worse.  Your symptoms do not improve within 10 days. Get help right away if:  You have a severe headache.  You have persistent vomiting.  You have pain or swelling around your face or  eyes.  You have vision problems.  You develop confusion.  Your neck is stiff.  You have trouble breathing. This information is not intended to replace advice given to you by your health care provider. Make sure you discuss any questions you have with your health care provider. Document Released: 04/19/2005 Document Revised: 12/14/2015 Document Reviewed: 02/12/2015 Elsevier Interactive Patient Education  2018 [employer].  Sinus Rinse What is a sinus rinse? A sinus rinse is a home treatment. It rinses your sinuses with a mixture of salt and water (saline solution). Sinuses are air-filled spaces in your skull behind the bones of your face and forehead. They open into your nasal cavity. To do a sinus rinse, you will need:  Saline solution.  Neti pot or spray bottle. This releases the saline solution into your nose and through your sinuses. You can buy neti pots and spray bottles at: ? [employer]. ? A health food store. ? Online.  When should I do a sinus rinse? A sinus rinse can help to  clear your nasal cavity. It can clear:  Mucus.  Dirt.  Dust.  Pollen.  You may do a sinus rinse when you have:  A cold.  A virus.  Allergies.  A sinus infection.  A stuffy nose.  If you are considering a sinus rinse:  Ask your child's doctor before doing a sinus rinse on your child.  Do not do a sinus rinse if you have had: ? Ear or nasal surgery. ? An ear infection. ? Blocked ears.  How do I do a sinus rinse?  Wash your hands.  Disinfect your device using the directions that came with the device.  Dry your device.  Use the solution that comes with your device or one that is sold separately in stores. Follow the mixing directions on the package.  Fill your device with the amount of saline solution as stated in the device instructions.  Stand over a sink and tilt your head sideways over the sink.  Place the spout of the device in your upper nostril (the one  closer to the ceiling).  Gently pour or squeeze the saline solution into the nasal cavity. The liquid should drain to the lower nostril if you are not too congested.  Gently blow your nose. Blowing too hard may cause ear pain.  Repeat in the other nostril.  Clean and rinse your device with clean water.  Air-dry your device. Are there risks of a sinus rinse? Sinus rinse is normally very safe and helpful. However, there are a few risks, which include:  A burning feeling in the sinuses. This may happen if you do not make the saline solution as instructed. Make sure to follow all directions when making the saline solution.  Infection from unclean water. This is rare, but possible.  Nasal irritation.  This information is not intended to replace advice given to you by your health care provider. Make sure you discuss any questions you have with your health care provider. Document Released: 11/14/2013 Document Revised: 03/16/2016 Document Reviewed: 09/04/2013 Elsevier Interactive Patient Education  2017 [employer].

## 2018-03-11 ENCOUNTER — Telehealth: Payer: Self-pay | Admitting: Internal Medicine

## 2018-03-13 NOTE — Telephone Encounter (Signed)
Pt is requesting refill on lorazepam 0.5mg .   Last OV: 12/22/2017 Last Fill: 01/11/2018 #30 and 1RF UDS: 05/27/2017 Low risk  NCCR printed- no discrepancies noted- sent for scanning

## 2018-03-13 NOTE — Telephone Encounter (Signed)
Sent!

## 2018-03-15 ENCOUNTER — Ambulatory Visit: Payer: 59 | Admitting: Internal Medicine

## 2018-03-16 ENCOUNTER — Encounter: Payer: Self-pay | Admitting: Internal Medicine

## 2018-03-16 ENCOUNTER — Ambulatory Visit (INDEPENDENT_AMBULATORY_CARE_PROVIDER_SITE_OTHER): Payer: 59 | Admitting: Internal Medicine

## 2018-03-16 VITALS — BP 124/68 | HR 78 | Temp 97.5°F | Resp 16 | Ht 72.0 in | Wt 208.0 lb

## 2018-03-16 DIAGNOSIS — N529 Male erectile dysfunction, unspecified: Secondary | ICD-10-CM | POA: Diagnosis not present

## 2018-03-16 DIAGNOSIS — F419 Anxiety disorder, unspecified: Secondary | ICD-10-CM | POA: Diagnosis not present

## 2018-03-16 DIAGNOSIS — G4700 Insomnia, unspecified: Secondary | ICD-10-CM

## 2018-03-16 NOTE — Progress Notes (Signed)
Pre visit review using our clinic review tool, if applicable. No additional management support is needed unless otherwise documented below in the visit note. 

## 2018-03-16 NOTE — Patient Instructions (Addendum)
  GO TO THE FRONT DESK Schedule your next appointment for a physical exam by August 2020  Continue lorazepam to help you sleep  Also start OTC melatonin every night  Okay to use Benadryl  ( Diphenhydramine ) 25 mg half or 1 tablet at night  HEALTHY SLEEP Sleep hygiene: Basic rules for a good night's sleep  Sleep only as much as you need to feel rested and then get out of bed  Keep a regular sleep schedule  Avoid forcing sleep  Exercise regularly for at least 20 minutes, preferably 4 to 5 hours before bedtime  Avoid caffeinated beverages after lunch  Avoid alcohol near bedtime: no "night cap"  Avoid smoking, especially in the evening  Do not go to bed hungry  Adjust bedroom environment  Avoid prolonged use of light-emitting screens before bedtime   Deal with your worries before bedtime

## 2018-03-16 NOTE — Progress Notes (Signed)
Subjective:    Patient ID: Trevor Hurst, male    DOB: 04-19-1961, 57 y.o.   MRN: [id number]  DOS:  03/16/2018 Type of visit - description : rov Interval history: Anxiety: Since the last office visit, stress at work is about the same by he is managing better. Insomnia: On lorazepam, not working as well as before, alternatives?. ED: Very good response to Viagra without apparent side effects.   Review of Systems   Past Medical History:  Diagnosis Date  . Anxiety   . Complication of anesthesia   . Hyperlipidemia   . PONV (postoperative nausea and vomiting)     Past Surgical History:  Procedure Laterality Date  . HERNIA REPAIR  12 yrs ago   R   . INGUINAL HERNIA REPAIR  12/24/2011   Procedure: LAPAROSCOPIC BILATERAL INGUINAL HERNIA REPAIR;  Surgeon: Trevor Curry, MD,FACS;  Location: [facility];  Service: General;;  DR Trevor Hurst SPOKE WITH PT WIFE ABOUT REPAIRING THE UMBILICAL AND LEFT INGUINAL HERNIA BOTH WITH MESH .  ALL  RISKS WERE DISCUSSED WITH  HER OVER THE PHONE.  WIFE INSTRUCTED HIM TO PROCEED WITH REPAIRING THE LEFT INGUINAL AND UMBILICAL HERNIA   . INSERTION OF MESH N/A 02/24/2016   Procedure: INSERTION OF MESH;  Surgeon: Trevor Pickerel, MD;  Location: [facility];  Service: General;  Laterality: N/A;  . UMBILICAL HERNIA REPAIR  12/24/2011   Procedure: HERNIA REPAIR UMBILICAL ADULT;  Surgeon: Trevor Curry, MD,FACS;  Location: [facility];  Service: General;  Laterality: N/A;  . VENTRAL HERNIA REPAIR N/A 02/24/2016   Procedure: LAPAROSCOPIC ASSISTED REPAIR OF SUPRAUMBILICAL  HERNIA WITH MESH;  Surgeon: Trevor Pickerel, MD;  Location: [facility];  Service: General;  Laterality: N/A;  . warts  12/23/11   dermatologist froze them on back calfs bilaterally    Social History   Socioeconomic History  . Marital status: Married    Spouse name: Trevor Hurst  . Number of children: 2  . Years of education: Not on file  . Highest education level: Not on file  Occupational History  . Occupation: Orthoptist: [employer]  . Financial resource strain: Not on file  . Food insecurity:    Worry: Not on file    Inability: Not on file  . Transportation needs:    Medical: Not on file    Non-medical: Not on file  Tobacco Use  . Smoking status: Former Smoker    Packs/day: 1.00    Years: 10.00    Pack years: 10.00    Last attempt to quit: 04/30/2011    Years since quitting: 6.8  . Smokeless tobacco: Never Used  . Tobacco comment: smoked 1979-2012 , up to 1 ppd. Off for total of 4 years.Smoked total 29 years.  Substance and Sexual Activity  . Alcohol use: No    Alcohol/week: 1.0 standard drinks    Types: 1 Standard drinks or equivalent per week  . Drug use: No  . Sexual activity: Not on file  Lifestyle  . Physical activity:    Days per week: Not on file    Minutes per session: Not on file  . Stress: Not on file  Relationships  . Social connections:    Talks on phone: Not on file    Gets together: Not on file    Attends religious service: Not on file    Active member of club or organization: Not on file    Attends meetings of clubs or  organizations: Not on file    Relationship status: Not on file  . Intimate partner violence:    Fear of current or ex partner: Not on file    Emotionally abused: Not on file    Physically abused: Not on file    Forced sexual activity: Not on file  Other Topics Concern  . Not on file  Social History Narrative   Lives w/ wife;   2 children graduated      Allergies as of 03/16/2018      Reactions   Oxycodone    With last surgery took one pain pill and slept for 14 hours and woke up in a sweat       Medication List        Accurate as of 03/16/18 11:59 PM. Always use your most recent med list.          b complex vitamins capsule Take 1 capsule by mouth daily with lunch.   clobetasol ointment 0.05 % Commonly known as:  TEMOVATE Apply 1 application topically every other day.   cyclobenzaprine 10 MG tablet Commonly  known as:  FLEXERIL Take 1 tablet (10 mg total) by mouth at bedtime as needed for muscle spasms.   fexofenadine 180 MG tablet Commonly known as:  ALLEGRA Take 180 mg by mouth daily.   ibuprofen 200 MG tablet Commonly known as:  ADVIL,MOTRIN Take 400 mg by mouth every 6 (six) hours as needed (for pain.).   LORazepam 0.5 MG tablet Commonly known as:  ATIVAN TAKE 1 TABLET BY MOUTH EVERY DAY AT BEDTIME AS NEEDED FOR SLEEP   nicotine polacrilex 4 MG gum Commonly known as:  NICORETTE Take 4 mg by mouth 4 (four) times daily as needed for smoking cessation.   sildenafil 20 MG tablet Commonly known as:  REVATIO Take 3-4 tablets (60-80 mg total) by mouth at bedtime as needed.   Vitamin D-3 25 MCG (1000 UT) Caps Take 1 capsule by mouth daily.          Objective:   Physical Exam BP 124/68 (BP Location: Left Arm, Patient Position: Sitting, Cuff Size: Small)   Pulse 78   Temp (!) 97.5 F (36.4 C) (Oral)   Resp 16   Ht 6' (1.829 m)   Wt 208 lb (94.3 kg)   SpO2 97%   BMI 28.21 kg/m  General:   Well developed, NAD, BMI noted. HEENT:  Normocephalic . Face symmetric, atraumatic Skin: Not pale. Not jaundice Neurologic:  alert & oriented X3.  Speech normal, gait appropriate for age and unassisted Psych--  Cognition and judgment appear intact.  Cooperative with normal attention span and concentration.  Behavior appropriate. No anxious or depressed appearing.     Assessment & Plan:   Assessment  Anxiety, insomnia -- diazepam qhs, also uses nicotine gum at night Nicotine use gum but no tobacco per se Hyperlipidemia Psoriasis (per derm) Pre-ca skin lesion R knee   PLAN: Anxiety: Stress is about the same but he is managing a little better.  I again stressed the importance of nonpharmacological management, ?see a counselor Insomnia: On diazepam, is not working as well as before and he also wonders about long-term side effects of benzos ( I do not think there is a major problem  with long-term side effects). He does not likes to take any other prescription sleeping pill. We agreed on: Improve sleeping habits if possible, see AVS. Continue lorazepam. Melatonin nightly. Benadryl as needed. Call if not better ED: Excellent response  to Viagra, RF as needed Declined flu shot RTC CPX 8-20 20

## 2018-03-17 DIAGNOSIS — N529 Male erectile dysfunction, unspecified: Secondary | ICD-10-CM | POA: Insufficient documentation

## 2018-03-17 DIAGNOSIS — G4700 Insomnia, unspecified: Secondary | ICD-10-CM | POA: Insufficient documentation

## 2018-03-17 NOTE — Assessment & Plan Note (Signed)
Anxiety: Stress is about the same but he is managing a little better.  I again stressed the importance of nonpharmacological management, ?see a counselor Insomnia: On diazepam, is not working as well as before and he also wonders about long-term side effects of benzos ( I do not think there is a major problem with long-term side effects). He does not likes to take any other prescription sleeping pill. We agreed on: Improve sleeping habits if possible, see AVS. Continue lorazepam. Melatonin nightly. Benadryl as needed. Call if not better ED: Excellent response to Viagra, RF as needed Declined flu shot RTC CPX 8-20 20

## 2018-05-07 ENCOUNTER — Telehealth: Payer: Self-pay | Admitting: Internal Medicine

## 2018-05-08 NOTE — Telephone Encounter (Signed)
Sent!

## 2018-05-08 NOTE — Telephone Encounter (Signed)
Pt is requesting refill on lorazepam.   Last OV: 03/16/2018 Last Fill: 03/19/2018 #30 and 1RF UDS: 05/27/2017 Low risk  NCCR in media from 03/13/2018

## 2018-08-14 ENCOUNTER — Other Ambulatory Visit: Payer: Self-pay | Admitting: Internal Medicine

## 2018-08-31 ENCOUNTER — Telehealth: Payer: Self-pay | Admitting: Internal Medicine

## 2018-09-01 NOTE — Telephone Encounter (Signed)
Lorazepam.   Last OV: 03/16/2018 Last Fill: 05/08/2018 #30 and 3rf UDS: 05/27/2017 Low risk

## 2018-09-01 NOTE — Telephone Encounter (Signed)
Sent!

## 2018-09-23 ENCOUNTER — Other Ambulatory Visit: Payer: Self-pay | Admitting: Internal Medicine

## 2018-11-22 ENCOUNTER — Telehealth: Payer: Self-pay | Admitting: Internal Medicine

## 2018-11-22 NOTE — Telephone Encounter (Signed)
Lorazepam refill.   Last OV; 03/16/2018 Last Fill; 09/01/2018 #30 and 2RF UDS: 05/27/2017 Low risk

## 2018-11-23 NOTE — Telephone Encounter (Signed)
sent 

## 2018-12-25 ENCOUNTER — Encounter: Payer: Self-pay | Admitting: Internal Medicine

## 2018-12-25 ENCOUNTER — Ambulatory Visit (INDEPENDENT_AMBULATORY_CARE_PROVIDER_SITE_OTHER): Payer: 59 | Admitting: Internal Medicine

## 2018-12-25 ENCOUNTER — Other Ambulatory Visit: Payer: Self-pay

## 2018-12-25 VITALS — BP 140/90 | HR 84 | Temp 97.2°F | Resp 16 | Ht 72.0 in | Wt 219.1 lb

## 2018-12-25 DIAGNOSIS — F419 Anxiety disorder, unspecified: Secondary | ICD-10-CM | POA: Diagnosis not present

## 2018-12-25 DIAGNOSIS — Z0000 Encounter for general adult medical examination without abnormal findings: Secondary | ICD-10-CM

## 2018-12-25 DIAGNOSIS — E785 Hyperlipidemia, unspecified: Secondary | ICD-10-CM

## 2018-12-25 DIAGNOSIS — G4700 Insomnia, unspecified: Secondary | ICD-10-CM

## 2018-12-25 DIAGNOSIS — Z79899 Other long term (current) drug therapy: Secondary | ICD-10-CM

## 2018-12-25 LAB — LIPID PANEL
Cholesterol: 204 mg/dL — ABNORMAL HIGH (ref 0–200)
HDL: 40 mg/dL (ref >39.00)
LDL Cholesterol: 131 mg/dL — ABNORMAL HIGH (ref 0–99)
NonHDL: 163.6
Total CHOL/HDL Ratio: 5
Triglycerides: 163 mg/dL — ABNORMAL HIGH (ref 0.0–149.0)
VLDL: 32.6 mg/dL (ref 0.0–40.0)

## 2018-12-25 LAB — CBC WITH DIFFERENTIAL/PLATELET
Basophils Absolute: 0 10*3/uL (ref 0.0–0.1)
Basophils Relative: 0.7 % (ref 0.0–3.0)
Eosinophils Absolute: 0.1 10*3/uL (ref 0.0–0.7)
Eosinophils Relative: 1.4 % (ref 0.0–5.0)
HCT: 46.9 % (ref 39.0–52.0)
Hemoglobin: 15.9 g/dL (ref 13.0–17.0)
Lymphocytes Relative: 27.4 % (ref 12.0–46.0)
Lymphs Abs: 1.3 10*3/uL (ref 0.7–4.0)
MCHC: 33.9 g/dL (ref 30.0–36.0)
MCV: 87.4 fl (ref 78.0–100.0)
Monocytes Absolute: 0.4 10*3/uL (ref 0.1–1.0)
Monocytes Relative: 9.1 % (ref 3.0–12.0)
Neutro Abs: 3 10*3/uL (ref 1.4–7.7)
Neutrophils Relative %: 61.4 % (ref 43.0–77.0)
Platelets: 155 10*3/uL (ref 150.0–400.0)
RBC: 5.36 Mil/uL (ref 4.22–5.81)
RDW: 13.5 % (ref 11.5–15.5)
WBC: 4.9 10*3/uL (ref 4.0–10.5)

## 2018-12-25 LAB — BASIC METABOLIC PANEL
BUN: 11 mg/dL (ref 6–23)
CO2: 29 mEq/L (ref 19–32)
Calcium: 9.4 mg/dL (ref 8.4–10.5)
Chloride: 103 mEq/L (ref 96–112)
Creatinine, Ser: 0.81 mg/dL (ref 0.40–1.50)
GFR: 98.23 mL/min (ref >60.00)
Glucose, Bld: 95 mg/dL (ref 70–99)
Potassium: 4.1 mEq/L (ref 3.5–5.1)
Sodium: 139 mEq/L (ref 135–145)

## 2018-12-25 NOTE — Patient Instructions (Signed)
GO TO THE LAB : Get the blood work     GO TO THE FRONT DESK Schedule your next appointment for physical exam in 1 year   Check the  blood pressure 2 or 3 times a month   BP GOAL is between 110/65 and  135/85. If it is consistently higher or lower, let me know

## 2018-12-25 NOTE — Progress Notes (Signed)
Subjective:    Patient ID: Trevor Hurst, male    DOB: [birth year], 58 y.o.   MRN: [id number]  DOS:  12/25/2018 Type of visit - description: CPX No major concerns  Review of Systems  A 14 point review of systems is negative    Past Medical History:  Diagnosis Date  . Anxiety   . Complication of anesthesia   . Hyperlipidemia   . PONV (postoperative nausea and vomiting)     Past Surgical History:  Procedure Laterality Date  . HERNIA REPAIR  12 yrs ago   R   . INGUINAL HERNIA REPAIR  12/24/2011   Procedure: LAPAROSCOPIC BILATERAL INGUINAL HERNIA REPAIR;  Surgeon: Gayland Curry, MD,FACS;  Location: [facility];  Service: General;;  DR Redmond Pulling SPOKE WITH PT WIFE ABOUT REPAIRING THE UMBILICAL AND LEFT INGUINAL HERNIA BOTH WITH MESH .  ALL  RISKS WERE DISCUSSED WITH  HER OVER THE PHONE.  WIFE INSTRUCTED HIM TO PROCEED WITH REPAIRING THE LEFT INGUINAL AND UMBILICAL HERNIA   . INSERTION OF MESH N/A 02/24/2016   Procedure: INSERTION OF MESH;  Surgeon: Greer Pickerel, MD;  Location: [facility];  Service: General;  Laterality: N/A;  . UMBILICAL HERNIA REPAIR  12/24/2011   Procedure: HERNIA REPAIR UMBILICAL ADULT;  Surgeon: Gayland Curry, MD,FACS;  Location: [facility];  Service: General;  Laterality: N/A;  . VENTRAL HERNIA REPAIR N/A 02/24/2016   Procedure: LAPAROSCOPIC ASSISTED REPAIR OF SUPRAUMBILICAL  HERNIA WITH MESH;  Surgeon: Greer Pickerel, MD;  Location: [facility];  Service: General;  Laterality: N/A;  . warts  12/23/11   dermatologist froze them on back calfs bilaterally    Social History   Socioeconomic History  . Marital status: Married    Spouse name: [name]  . Number of children: 2  . Years of education: Not on file  . Highest education level: Not on file  Occupational History  . Occupation: Building control surveyor: [employer]  . Financial resource strain: Not on file  . Food insecurity    Worry: Not on file    Inability: Not on file  . Transportation needs    Medical: Not on file     Non-medical: Not on file  Tobacco Use  . Smoking status: Former Smoker    Packs/day: 1.00    Years: 10.00    Pack years: 10.00    Quit date: 04/30/2011    Years since quitting: 7.6  . Smokeless tobacco: Never Used  . Tobacco comment: smoked 1979-2012 , ~ 1 ppd. Off for total of 4 years.Smoked total 29 years.  Substance and Sexual Activity  . Alcohol use: No    Alcohol/week: 1.0 standard drinks    Types: 1 Standard drinks or equivalent per week  . Drug use: No  . Sexual activity: Not on file  Lifestyle  . Physical activity    Days per week: Not on file    Minutes per session: Not on file  . Stress: Not on file  Relationships  . Social Herbalist on phone: Not on file    Gets together: Not on file    Attends religious service: Not on file    Active member of club or organization: Not on file    Attends meetings of clubs or organizations: Not on file    Relationship status: Not on file  . Intimate partner violence    Fear of current or ex partner: Not on file  Emotionally abused: Not on file    Physically abused: Not on file    Forced sexual activity: Not on file  Other Topics Concern  . Not on file  Social History Narrative   Lives w/ wife;   2 children graduated     Family History  Problem Relation Age of Onset  . Lung cancer Father        was a smoker  . Transient ischemic attack Mother   . Depression Sister   . Diabetes Paternal Grandfather   . Heart disease Neg Hx   . Colon cancer Neg Hx   . Prostate cancer Neg Hx      Allergies as of 12/25/2018      Reactions   Oxycodone    With last surgery took one pain pill and slept for 14 hours and woke up in a sweat       Medication List       Accurate as of December 25, 2018 11:59 PM. If you have any questions, ask your nurse or doctor.        b complex vitamins capsule Take 1 capsule by mouth daily with lunch.   clobetasol ointment 0.05 % Commonly known as: TEMOVATE Apply 1 application  topically every other day.   cyclobenzaprine 10 MG tablet Commonly known as: FLEXERIL Take 1 tablet (10 mg total) by mouth at bedtime as needed for muscle spasms.   fexofenadine 180 MG tablet Commonly known as: ALLEGRA Take 180 mg by mouth daily.   ibuprofen 200 MG tablet Commonly known as: ADVIL Take 400 mg by mouth every 6 (six) hours as needed (for pain.).   LORazepam 0.5 MG tablet Commonly known as: ATIVAN Take 1 tablet (0.5 mg total) by mouth at bedtime as needed for sleep.   nicotine polacrilex 4 MG gum Commonly known as: NICORETTE Take 4 mg by mouth 4 (four) times daily as needed for smoking cessation.   sildenafil 20 MG tablet Commonly known as: REVATIO TAKE 3 TO 4 TABLETS BY MOUTH AT BEDTIME AS NEEDED.   Vitamin D-3 25 MCG (1000 UT) Caps Take 1 capsule by mouth daily.           Objective:   Physical Exam BP 140/90   Pulse 84   Temp (!) 97.2 F (36.2 C) (Temporal)   Resp 16   Ht 6' (1.829 m)   Wt 219 lb 2 oz (99.4 kg)   SpO2 100%   BMI 29.72 kg/m  General: Well developed, NAD, BMI noted Neck: No  thyromegaly  HEENT:  Normocephalic . Face symmetric, atraumatic Lungs:  CTA B Normal respiratory effort, no intercostal retractions, no accessory muscle use. Heart: RRR,  no murmur.  No pretibial edema bilaterally  Abdomen:  Not distended, soft, non-tender. No rebound or rigidity.   Skin: Exposed areas without rash. Not pale. Not jaundice Neurologic:  alert & oriented X3.  Speech normal, gait appropriate for age and unassisted Strength symmetric and appropriate for age.  Psych: Cognition and judgment appear intact.  Cooperative with normal attention span and concentration.  Behavior appropriate. No anxious or depressed appearing.      Assessment     Assessment  Anxiety, insomnia -- diazepam qhs, also uses nicotine gum at night Nicotine use gum but no tobacco per se Hyperlipidemia Psoriasis (per derm) Pre-ca skin lesion R knee   PLAN:  Here for CPX Anxiety, insomnia: Controlled with diazepam at night Nicotine addiction: Uses a nicotine gum  does not smoke Hyperlipidemia: Diet  controlled, CVRF 10 years 8.8 % Elevated BP: BP upon arrival slightly elevated, he will come back in few weeks to get a shingles shot and recheck his blood pressure.  Recommend to check BPs at home Otherwise RTC 1 year

## 2018-12-25 NOTE — Progress Notes (Signed)
Pre visit review using our clinic review tool, if applicable. No additional management support is needed unless otherwise documented below in the visit note. 

## 2018-12-26 LAB — TSH: TSH: 0.56 u[IU]/mL (ref 0.35–4.50)

## 2018-12-26 NOTE — Assessment & Plan Note (Signed)
-  Td 2016 - shingrix: Discussed, will return in few weeks to get the first injection.  Declined to have it today - flu shot recommended  -[facility] cscope 11-2014, wnl , 10 years per report -Prostate cancer screening, DRE -PSA wnl 2019 -Former smoker, + FH lung cancer, lung cancer screening discussed declined  -Diet-exercise: He is back in today gym, exercises regularly.  Discussed diet -Labs: BMP, FLP, CBC, TSH

## 2018-12-26 NOTE — Assessment & Plan Note (Signed)
Here for CPX Anxiety, insomnia: Controlled with diazepam at night Nicotine addiction: Uses a nicotine gum  does not smoke Hyperlipidemia: Diet controlled, CVRF 10 years 8.8 % Elevated BP: BP upon arrival slightly elevated, he will come back in few weeks to get a shingles shot and recheck his blood pressure.  Recommend to check BPs at home Otherwise RTC 1 year

## 2018-12-27 LAB — PAIN MGMT, PROFILE 8 W/CONF, U
6 Acetylmorphine: NEGATIVE ng/mL
Alcohol Metabolites: NEGATIVE ng/mL (ref <500)
Alphahydroxyalprazolam: NEGATIVE ng/mL
Alphahydroxymidazolam: NEGATIVE ng/mL
Alphahydroxytriazolam: NEGATIVE ng/mL
Aminoclonazepam: NEGATIVE ng/mL
Amphetamines: NEGATIVE ng/mL
Benzodiazepines: POSITIVE ng/mL
Buprenorphine, Urine: NEGATIVE ng/mL
Cocaine Metabolite: NEGATIVE ng/mL
Creatinine: 51.3 mg/dL
Hydroxyethylflurazepam: NEGATIVE ng/mL
Lorazepam: 113 ng/mL
MDMA: NEGATIVE ng/mL
Marijuana Metabolite: NEGATIVE ng/mL
Nordiazepam: NEGATIVE ng/mL
Opiates: NEGATIVE ng/mL
Oxazepam: NEGATIVE ng/mL
Oxidant: NEGATIVE ug/mL
Oxycodone: NEGATIVE ng/mL
Temazepam: NEGATIVE ng/mL
pH: 6.1 (ref 4.5–9.0)

## 2018-12-28 ENCOUNTER — Encounter: Payer: Self-pay | Admitting: Internal Medicine

## 2019-01-04 ENCOUNTER — Encounter: Payer: Self-pay | Admitting: Internal Medicine

## 2019-01-04 ENCOUNTER — Other Ambulatory Visit: Payer: Self-pay

## 2019-01-04 ENCOUNTER — Ambulatory Visit (INDEPENDENT_AMBULATORY_CARE_PROVIDER_SITE_OTHER): Payer: 59

## 2019-01-04 VITALS — BP 142/88 | HR 64

## 2019-01-04 DIAGNOSIS — Z23 Encounter for immunization: Secondary | ICD-10-CM | POA: Diagnosis not present

## 2019-01-04 NOTE — Progress Notes (Addendum)
Patient here today for Blood pressure check and first shingles injection. BP Readings from Last 3 Encounters:  12/25/18 140/90  03/16/18 124/68  02/24/18 126/80   His blood pressure today is 142/88.  Per Dr. Larose Kells ok to continue same management.  Shingles injection administered w/o any complications, patient will come back in 2-6 months for second shingrix.   Diastolic BP is slightly elevated, will asked patient to monitor at home. Kathlene November, MD

## 2019-01-23 ENCOUNTER — Other Ambulatory Visit: Payer: Self-pay | Admitting: Internal Medicine

## 2019-01-23 NOTE — Telephone Encounter (Signed)
Last visit 12/25/2018, next visit in 1 year, RF sent

## 2019-01-25 ENCOUNTER — Ambulatory Visit (INDEPENDENT_AMBULATORY_CARE_PROVIDER_SITE_OTHER): Payer: 59

## 2019-01-25 ENCOUNTER — Other Ambulatory Visit: Payer: Self-pay

## 2019-01-25 DIAGNOSIS — Z23 Encounter for immunization: Secondary | ICD-10-CM

## 2019-01-25 NOTE — Progress Notes (Signed)
Here for flu shot

## 2019-03-08 ENCOUNTER — Ambulatory Visit (INDEPENDENT_AMBULATORY_CARE_PROVIDER_SITE_OTHER): Payer: 59 | Admitting: *Deleted

## 2019-03-08 ENCOUNTER — Other Ambulatory Visit: Payer: Self-pay

## 2019-03-08 DIAGNOSIS — Z23 Encounter for immunization: Secondary | ICD-10-CM | POA: Diagnosis not present

## 2019-03-08 NOTE — Progress Notes (Signed)
Patient here for 2nd dose of shingles vaccine.  Vaccine given in left deltoid and patient tolerated well.

## 2019-05-21 ENCOUNTER — Telehealth: Payer: Self-pay | Admitting: Internal Medicine

## 2019-05-22 NOTE — Telephone Encounter (Signed)
Lorazepam refill.   Last OV: 12/25/2018 Last Fill: 01/23/2019 #30 and 3RF Pt sig: 1 tab qhs prn UDS: 12/25/2018 Low risk

## 2019-05-22 NOTE — Telephone Encounter (Signed)
Sent!

## 2019-08-15 ENCOUNTER — Other Ambulatory Visit: Payer: Self-pay | Admitting: Internal Medicine

## 2019-09-12 ENCOUNTER — Other Ambulatory Visit: Payer: Self-pay | Admitting: Internal Medicine

## 2019-09-16 ENCOUNTER — Telehealth: Payer: Self-pay | Admitting: Internal Medicine

## 2019-09-17 NOTE — Telephone Encounter (Signed)
Lorazepam refill.   Last OV: 12/25/2018 Last Fill: 05/22/2019 #30 and 3RF Pt sig: 1 tab qhs prn UDS: 12/25/2018 Low risk

## 2019-09-17 NOTE — Telephone Encounter (Signed)
PDMP okay, RF sent 

## 2019-12-27 ENCOUNTER — Other Ambulatory Visit: Payer: Self-pay

## 2019-12-27 ENCOUNTER — Encounter: Payer: Self-pay | Admitting: Internal Medicine

## 2019-12-27 ENCOUNTER — Ambulatory Visit (INDEPENDENT_AMBULATORY_CARE_PROVIDER_SITE_OTHER): Payer: 59 | Admitting: Internal Medicine

## 2019-12-27 VITALS — BP 138/86 | HR 86 | Temp 98.0°F | Resp 12 | Ht 72.0 in | Wt 213.6 lb

## 2019-12-27 DIAGNOSIS — Z0000 Encounter for general adult medical examination without abnormal findings: Secondary | ICD-10-CM | POA: Diagnosis not present

## 2019-12-27 DIAGNOSIS — E010 Iodine-deficiency related diffuse (endemic) goiter: Secondary | ICD-10-CM | POA: Diagnosis not present

## 2019-12-27 LAB — COMPREHENSIVE METABOLIC PANEL
AG Ratio: 1.9 (calc) (ref 1.0–2.5)
ALT: 22 U/L (ref 9–46)
AST: 24 U/L (ref 10–35)
Albumin: 4.4 g/dL (ref 3.6–5.1)
Alkaline phosphatase (APISO): 77 U/L (ref 35–144)
BUN: 17 mg/dL (ref 7–25)
CO2: 29 mmol/L (ref 20–32)
Calcium: 9.2 mg/dL (ref 8.6–10.3)
Chloride: 102 mmol/L (ref 98–110)
Creat: 0.9 mg/dL (ref 0.70–1.33)
Globulin: 2.3 g/dL (calc) (ref 1.9–3.7)
Glucose, Bld: 92 mg/dL (ref 65–99)
Potassium: 4.2 mmol/L (ref 3.5–5.3)
Sodium: 139 mmol/L (ref 135–146)
Total Bilirubin: 0.6 mg/dL (ref 0.2–1.2)
Total Protein: 6.7 g/dL (ref 6.1–8.1)

## 2019-12-27 LAB — CBC WITH DIFFERENTIAL/PLATELET
Absolute Monocytes: 568 cells/uL (ref 200–950)
Basophils Absolute: 29 cells/uL (ref 0–200)
Basophils Relative: 0.5 %
Eosinophils Absolute: 81 cells/uL (ref 15–500)
Eosinophils Relative: 1.4 %
HCT: 48.1 % (ref 38.5–50.0)
Hemoglobin: 16.2 g/dL (ref 13.2–17.1)
Lymphs Abs: 1369 cells/uL (ref 850–3900)
MCH: 29.4 pg (ref 27.0–33.0)
MCHC: 33.7 g/dL (ref 32.0–36.0)
MCV: 87.3 fL (ref 80.0–100.0)
MPV: 9.4 fL (ref 7.5–12.5)
Monocytes Relative: 9.8 %
Neutro Abs: 3753 cells/uL (ref 1500–7800)
Neutrophils Relative %: 64.7 %
Platelets: 170 10*3/uL (ref 140–400)
RBC: 5.51 10*6/uL (ref 4.20–5.80)
RDW: 12.4 % (ref 11.0–15.0)
Total Lymphocyte: 23.6 %
WBC: 5.8 10*3/uL (ref 3.8–10.8)

## 2019-12-27 LAB — LIPID PANEL
Cholesterol: 201 mg/dL — ABNORMAL HIGH (ref <200)
HDL: 48 mg/dL (ref >=40)
LDL Cholesterol (Calc): 134 mg/dL (calc) — ABNORMAL HIGH
Non-HDL Cholesterol (Calc): 153 mg/dL (calc) — ABNORMAL HIGH (ref <130)
Total CHOL/HDL Ratio: 4.2 (calc) (ref <5.0)
Triglycerides: 90 mg/dL (ref <150)

## 2019-12-27 LAB — PSA: PSA: 2.2 ng/mL (ref <=4.0)

## 2019-12-27 LAB — TSH: TSH: 0.51 mIU/L (ref 0.40–4.50)

## 2019-12-27 NOTE — Patient Instructions (Signed)
Check the  blood pressure 2 or 3 times a month   BP GOAL is between 110/65 and  135/85. If it is consistently higher or lower, let me know  Will schedule left thyroid ultrasound   GO TO THE LAB : Get the blood work     [place], [place] back for a physical exam in 1 year

## 2019-12-27 NOTE — Assessment & Plan Note (Signed)
-  Td 2016 - s/p shingrix - s/p c-19 vaccines - flu shot recommended  -[facility]S cscope 11-2014, wnl , 10 years per report -Prostate cancer screening: DRE normal, check a PSA -Former smoker, + FH lung cancer, lung cancer screening : declined before. -Diet-exercise: He is actually doing well, exercising up to 4 times a week. -Labs:  CMP, FLP, CBC, TSH, PSA

## 2019-12-27 NOTE — Progress Notes (Signed)
Subjective:    Patient ID: Trevor Hurst, male    DOB: 1960-07-24, 59 y.o.   MRN: [id number]  DOS:  12/27/2019 Type of visit - description: CPX Has no concerns  Wt Readings from Last 3 Encounters:  12/27/19 213 lb 9.6 oz (96.9 kg)  12/25/18 219 lb 2 oz (99.4 kg)  03/16/18 208 lb (94.3 kg)     Review of Systems  A 14 point review of systems is negative    Past Medical History:  Diagnosis Date   Anxiety    Complication of anesthesia    Hyperlipidemia    PONV (postoperative nausea and vomiting)     Past Surgical History:  Procedure Laterality Date   HERNIA REPAIR  12 yrs ago   R    INGUINAL HERNIA REPAIR  12/24/2011   Procedure: LAPAROSCOPIC BILATERAL INGUINAL HERNIA REPAIR;  Surgeon: Gayland Curry, MD,FACS;  Location: [facility];  Service: General;;  DR Redmond Pulling SPOKE WITH PT WIFE ABOUT REPAIRING THE UMBILICAL AND LEFT INGUINAL HERNIA BOTH WITH MESH .  ALL  RISKS WERE DISCUSSED WITH  HER OVER THE PHONE.  WIFE INSTRUCTED HIM TO PROCEED WITH REPAIRING THE LEFT INGUINAL AND UMBILICAL HERNIA    INSERTION OF MESH N/A 02/24/2016   Procedure: INSERTION OF MESH;  Surgeon: Greer Pickerel, MD;  Location: [facility];  Service: General;  Laterality: N/A;   UMBILICAL HERNIA REPAIR  12/24/2011   Procedure: HERNIA REPAIR UMBILICAL ADULT;  Surgeon: Gayland Curry, MD,FACS;  Location: [facility];  Service: General;  Laterality: N/A;   VENTRAL HERNIA REPAIR N/A 02/24/2016   Procedure: LAPAROSCOPIC ASSISTED REPAIR OF SUPRAUMBILICAL  HERNIA WITH MESH;  Surgeon: Greer Pickerel, MD;  Location: [facility];  Service: General;  Laterality: N/A;   warts  12/23/11   dermatologist froze them on back calfs bilaterally    Allergies as of 12/27/2019      Reactions   Oxycodone    With last surgery took one pain pill and slept for 14 hours and woke up in a sweat       Medication List       Accurate as of December 27, 2019  2:04 PM. If you have any questions, ask your nurse or doctor.        b complex vitamins  capsule Take 1 capsule by mouth daily with lunch.   clobetasol ointment 0.05 % Commonly known as: TEMOVATE Apply 1 application topically every other day.   cyclobenzaprine 10 MG tablet Commonly known as: FLEXERIL Take 1 tablet (10 mg total) by mouth at bedtime as needed for muscle spasms.   fexofenadine 180 MG tablet Commonly known as: ALLEGRA Take 180 mg by mouth daily.   ibuprofen 200 MG tablet Commonly known as: ADVIL Take 400 mg by mouth every 6 (six) hours as needed (for pain.).   LORazepam 0.5 MG tablet Commonly known as: ATIVAN TAKE 1 TABLET (0.5 MG TOTAL) BY MOUTH AT BEDTIME AS NEEDED FOR SLEEP.   nicotine polacrilex 4 MG gum Commonly known as: NICORETTE Take 4 mg by mouth 4 (four) times daily as needed for smoking cessation.   sildenafil 20 MG tablet Commonly known as: REVATIO TAKE 3 TO 4 TABLETS BY MOUTH AT BEDTIME AS NEEDED.   Vitamin D-3 25 MCG (1000 UT) Caps Take 1 capsule by mouth daily.          Objective:   Physical Exam BP 138/86 (BP Location: Left Arm, Cuff Size: Large)    Pulse 86  Temp 98 F (36.7 C) (Oral)    Resp 12    Ht 6' (1.829 m)    Wt 213 lb 9.6 oz (96.9 kg)    SpO2 99%    BMI 28.97 kg/m  General: Well developed, NAD, BMI noted Neck: + Thyromegaly, right side, not tender, not nodular. HEENT:  Normocephalic . Face symmetric, atraumatic Lungs:  CTA B Normal respiratory effort, no intercostal retractions, no accessory muscle use. Heart: RRR,  no murmur.  Abdomen:  Not distended, soft, non-tender. No rebound or rigidity.   Lower extremities: no pretibial edema bilaterally  Skin: Exposed areas without rash. Not pale. Not jaundice DRE: Normal sphincter tone, brown stools, prostate normal Neurologic:  alert & oriented X3.  Speech normal, gait appropriate for age and unassisted Strength symmetric and appropriate for age.  Psych: Cognition and judgment appear intact.  Cooperative with normal attention span and concentration.    Behavior appropriate. No anxious or depressed appearing.     Assessment     Assessment  Anxiety, insomnia -- diazepam qhs, also uses nicotine gum at night Nicotine use gum but no tobacco per se Hyperlipidemia Psoriasis (per derm) Pre-ca skin lesion R knee   PLAN: Here for CPX Elevated BP?  Patient is concerned about his blood pressure, 138/86 upon arrival, I recheck 140/85.  Recommend ambulatory BPs.  Goals provided. Anxiety, insomnia: On diazepam as needed Former smoker: Still using nicotine gum. Hyperlipidemia: Diet controlled, checking labs Thyromegaly see physical exam, will get thyroid ultrasound RTC 1 year   This visit occurred during the SARS-CoV-2 public health emergency.  Safety protocols were in place, including screening questions prior to the visit, additional usage of staff PPE, and extensive cleaning of exam room while observing appropriate contact time as indicated for disinfecting solutions.

## 2019-12-27 NOTE — Assessment & Plan Note (Signed)
Here for CPX Elevated BP?  Patient is concerned about his blood pressure, 138/86 upon arrival, I recheck 140/85.  Recommend ambulatory BPs.  Goals provided. Anxiety, insomnia: On diazepam as needed Former smoker: Still using nicotine gum. Hyperlipidemia: Diet controlled, checking labs Thyromegaly see physical exam, will get thyroid ultrasound RTC 1 year

## 2019-12-28 ENCOUNTER — Ambulatory Visit (HOSPITAL_BASED_OUTPATIENT_CLINIC_OR_DEPARTMENT_OTHER): Payer: 59

## 2019-12-28 ENCOUNTER — Ambulatory Visit (INDEPENDENT_AMBULATORY_CARE_PROVIDER_SITE_OTHER): Payer: 59

## 2019-12-28 DIAGNOSIS — E041 Nontoxic single thyroid nodule: Secondary | ICD-10-CM

## 2019-12-28 DIAGNOSIS — E010 Iodine-deficiency related diffuse (endemic) goiter: Secondary | ICD-10-CM | POA: Diagnosis not present

## 2020-01-01 ENCOUNTER — Other Ambulatory Visit: Payer: Self-pay | Admitting: Internal Medicine

## 2020-01-01 DIAGNOSIS — E041 Nontoxic single thyroid nodule: Secondary | ICD-10-CM

## 2020-01-01 DIAGNOSIS — E010 Iodine-deficiency related diffuse (endemic) goiter: Secondary | ICD-10-CM

## 2020-01-12 ENCOUNTER — Telehealth: Payer: Self-pay | Admitting: Internal Medicine

## 2020-01-14 NOTE — Telephone Encounter (Signed)
pdmp ok, rx sent

## 2020-01-14 NOTE — Telephone Encounter (Signed)
Lorazepam refill.   Last OV: 12/27/2019 Last Fill: 09/17/2019 #30 and 3RF Pt sig: 1 tab qhs prn UDS: 12/25/2018 Low risk

## 2020-01-15 ENCOUNTER — Other Ambulatory Visit (HOSPITAL_COMMUNITY)
Admission: RE | Admit: 2020-01-15 | Discharge: 2020-01-15 | Disposition: A | Discharge status: Home / Self Care | Payer: 59 | Source: Ambulatory Visit | Attending: Radiology | Admitting: Radiology

## 2020-01-15 ENCOUNTER — Ambulatory Visit
Admission: RE | Admit: 2020-01-15 | Discharge: 2020-01-15 | Disposition: A | Discharge status: Home / Self Care | Payer: 59 | Source: Ambulatory Visit | Attending: Internal Medicine | Admitting: Internal Medicine

## 2020-01-15 DIAGNOSIS — E041 Nontoxic single thyroid nodule: Secondary | ICD-10-CM | POA: Diagnosis present

## 2020-01-15 DIAGNOSIS — D440 Neoplasm of uncertain behavior of thyroid gland: Secondary | ICD-10-CM | POA: Insufficient documentation

## 2020-01-15 DIAGNOSIS — E010 Iodine-deficiency related diffuse (endemic) goiter: Secondary | ICD-10-CM

## 2020-01-16 LAB — CYTOLOGY - NON PAP

## 2020-01-17 ENCOUNTER — Other Ambulatory Visit: Payer: Self-pay

## 2020-01-17 ENCOUNTER — Telehealth: Payer: Self-pay | Admitting: Internal Medicine

## 2020-01-17 DIAGNOSIS — E010 Iodine-deficiency related diffuse (endemic) goiter: Secondary | ICD-10-CM

## 2020-01-17 DIAGNOSIS — E041 Nontoxic single thyroid nodule: Secondary | ICD-10-CM

## 2020-01-18 NOTE — Telephone Encounter (Signed)
Pt returned your call after you left yesterday. He is asking for a call back at your convenience please.

## 2020-01-18 NOTE — Telephone Encounter (Signed)
Discuss thyroid biopsy report, inconclusive, already referred to endocrinology for another opinion. I answered his questions to the best of my ability

## 2020-02-05 ENCOUNTER — Encounter: Payer: Self-pay | Admitting: Internal Medicine

## 2020-02-05 ENCOUNTER — Other Ambulatory Visit: Payer: Self-pay

## 2020-02-05 ENCOUNTER — Ambulatory Visit (INDEPENDENT_AMBULATORY_CARE_PROVIDER_SITE_OTHER): Payer: 59 | Admitting: Internal Medicine

## 2020-02-05 VITALS — BP 152/78 | HR 92 | Ht 72.0 in | Wt 221.0 lb

## 2020-02-05 DIAGNOSIS — E042 Nontoxic multinodular goiter: Secondary | ICD-10-CM

## 2020-02-05 NOTE — Patient Instructions (Signed)
I have ordered your thyroid biopsies to be done at [facility] in 3 months  I will keep you posted about these results

## 2020-02-05 NOTE — Progress Notes (Signed)
Name: Trevor Hurst  MRN/ DOB: [id number], 03-Oct-1960    Age/ Sex: 59 y.o., male    PCP: Colon Branch, MD   Reason for Endocrinology Evaluation: MNG     Date of Initial Endocrinology Evaluation: 02/05/2020     HPI: Trevor Hurst is a 59 y.o. male with a past medical history of MNG. The patient presented for initial endocrinology clinic visit on 02/05/2020 for consultative assistance with his MNG.   Pt was noted to have thyromegaly on exam in 12/2019 which prompted a thyroid ultrasound revealing MNG. Pt has a right inferior nodule ( 2.9 cm) and a left superior 1.7 cm as well as  Left mid nodule 4.9 cm , meeting criteria for FNA.   An attempt to biopsy of left superior and mid nodules in 01/2020 showing scan cellularity. ( Bethesda Category I)     Today he denies any local neck symptoms Has an intentional weight loss Denies palpitations    No prior exposure to radiation No FH of thyroid disease    HISTORY:  Past Medical History:  Past Medical History:  Diagnosis Date   Anxiety    Complication of anesthesia    Hyperlipidemia    PONV (postoperative nausea and vomiting)    Past Surgical History:  Past Surgical History:  Procedure Laterality Date   HERNIA REPAIR  12 yrs ago   R    INGUINAL HERNIA REPAIR  12/24/2011   Procedure: LAPAROSCOPIC BILATERAL INGUINAL HERNIA REPAIR;  Surgeon: Gayland Curry, MD,FACS;  Location: [facility];  Service: General;;  DR Redmond Pulling SPOKE WITH PT WIFE ABOUT REPAIRING THE UMBILICAL AND LEFT INGUINAL HERNIA BOTH WITH MESH .  ALL  RISKS WERE DISCUSSED WITH  HER OVER THE PHONE.  WIFE INSTRUCTED HIM TO PROCEED WITH REPAIRING THE LEFT INGUINAL AND UMBILICAL HERNIA    INSERTION OF MESH N/A 02/24/2016   Procedure: INSERTION OF MESH;  Surgeon: Greer Pickerel, MD;  Location: [facility];  Service: General;  Laterality: N/A;   UMBILICAL HERNIA REPAIR  12/24/2011   Procedure: HERNIA REPAIR UMBILICAL ADULT;  Surgeon: Gayland Curry, MD,FACS;  Location: [facility];   Service: General;  Laterality: N/A;   VENTRAL HERNIA REPAIR N/A 02/24/2016   Procedure: LAPAROSCOPIC ASSISTED REPAIR OF SUPRAUMBILICAL  HERNIA WITH MESH;  Surgeon: Greer Pickerel, MD;  Location: [facility];  Service: General;  Laterality: N/A;   warts  12/23/11   dermatologist froze them on back calfs bilaterally      Social History:  reports that he quit smoking about 8 years ago. He has a 10.00 pack-year smoking history. He has never used smokeless tobacco. He reports that he does not drink alcohol and does not use drugs.  Family History: family history includes Depression in his sister; Diabetes in his paternal grandfather; Lung cancer in his father; Transient ischemic attack in his mother.   HOME MEDICATIONS: Allergies as of 02/05/2020      Reactions   Oxycodone    With last surgery took one pain pill and slept for 14 hours and woke up in a sweat       Medication List       Accurate as of February 05, 2020  4:24 PM. If you have any questions, ask your nurse or doctor.        b complex vitamins capsule Take 1 capsule by mouth daily with lunch.   clobetasol ointment 0.05 % Commonly known as: TEMOVATE Apply 1 application topically every other day.  cyclobenzaprine 10 MG tablet Commonly known as: FLEXERIL Take 1 tablet (10 mg total) by mouth at bedtime as needed for muscle spasms.   fexofenadine 180 MG tablet Commonly known as: ALLEGRA Take 180 mg by mouth daily.   ibuprofen 200 MG tablet Commonly known as: ADVIL Take 400 mg by mouth every 6 (six) hours as needed (for pain.).   LORazepam 0.5 MG tablet Commonly known as: ATIVAN TAKE 1 TABLET (0.5 MG TOTAL) BY MOUTH AT BEDTIME AS NEEDED FOR SLEEP.   nicotine polacrilex 4 MG gum Commonly known as: NICORETTE Take 4 mg by mouth 4 (four) times daily as needed for smoking cessation.   sildenafil 20 MG tablet Commonly known as: REVATIO TAKE 3 TO 4 TABLETS BY MOUTH AT BEDTIME AS NEEDED.   Vitamin D-3 25 MCG (1000 UT)  Caps Take 1 capsule by mouth daily.         REVIEW OF SYSTEMS: A comprehensive ROS was conducted with the patient and is negative except as per HPI and below:  ROS     OBJECTIVE:  VS: BP (!) 152/78    Pulse 92    Ht 6' (1.829 m)    Wt 221 lb (100.2 kg)    SpO2 99%    BMI 29.97 kg/m    Wt Readings from Last 3 Encounters:  02/05/20 221 lb (100.2 kg)  12/27/19 213 lb 9.6 oz (96.9 kg)  12/25/18 219 lb 2 oz (99.4 kg)     EXAM: General: Pt appears well and is in NAD  Neck: General: Supple without adenopathy. Thyroid: Thyroid size normal.nodules appreciated.   Lungs: Clear with good BS bilat with no rales, rhonchi, or wheezes  Heart: Auscultation: RRR.  Abdomen: Normoactive bowel sounds, soft, nontender, without masses or organomegaly palpable  Extremities:  BL LE: No pretibial edema normal ROM and strength.  Skin: Hair: Texture and amount normal with gender appropriate distribution Skin Inspection: No rashes, acanthosis nigricans/skin tags. No lipohypertrophy Skin Palpation: Skin temperature, texture, and thickness normal to palpation  Neuro: Cranial nerves: II - XII grossly intact  Motor: Normal strength throughout DTRs: 2+ and symmetric in UE without delay in relaxation phase  Mental Status: Judgment, insight: Intact Orientation: Oriented to time, place, and person Mood and affect: No depression, anxiety, or agitation     DATA REVIEWED: Results for NNAEMEKA, SAMSON (MRN [id number]) as of 02/05/2020 12:46  Ref. Range 12/27/2019 08:52  TSH Latest Ref Range: 0.40 - 4.50 mIU/L 0.51       Thyroid ULtrasound 12/28/2019 FINDINGS: Parenchymal Echotexture: Moderately heterogenous  Isthmus: Borderline enlarged measures 0.8 cm in diameter  Right lobe: Enlarged measuring 6.4 x 2.9 x 2.9 cm  Left lobe: Enlarged measuring 9.1 x 3.9 x 5.2 cm  _________________________________________________________  Estimated total number of nodules >/= 1 cm: 5  Number of spongiform  nodules >/=  2 cm not described below (TR1): 0  Number of mixed cystic and solid nodules >/= 1.5 cm not described below (TR2): 0  _________________________________________________________  Nodule described labeled #1 appears to be 2 discrete thyroid nodules as follows:  There is an approximately 1.7 cm spongiform/benign-appearing nodule within the superior pole the right lobe of the thyroid which does not meet imaging criteria to recommend percutaneous sampling or continued dedicated follow-up.  There is an approximately 1.4 cm partially cystic though predominantly solid isoechoic ill-defined nodule/pseudonodule within the superior pole the right lobe of the thyroid which does not meet imaging criteria to recommend percutaneous sampling or continued dedicated follow-up.  _________________________________________________________  Nodule # 2:  Location: Right; Mid  Maximum size: 2.9 cm; Other 2 dimensions: 2.9 x 1.9 cm  Composition: solid/almost completely solid (2)  Echogenicity: isoechoic (1)  Shape: not taller-than-wide (0)  Margins: smooth (0)  Echogenic foci: none (0)  ACR TI-RADS total points: 3.  ACR TI-RADS risk category: TR3 (3 points).  ACR TI-RADS recommendations:  **Given size (>/= 2.5 cm) and appearance, fine needle aspiration of this mildly suspicious nodule should be considered based on TI-RADS criteria.  _________________________________________________________  Nodule # 3:  Location: Left; Superior  Maximum size: 1.7 cm; Other 2 dimensions: 1.3 x 1.3 cm  Composition: solid/almost completely solid (2)  Echogenicity: hypoechoic (2)  Shape: not taller-than-wide (0)  Margins: smooth (0)  Echogenic foci: none (0)  ACR TI-RADS total points: 4.  ACR TI-RADS risk category: TR4 (4-6 points).  ACR TI-RADS recommendations:  **Given size (>/= 1.5 cm) and appearance, fine needle aspiration of this moderately  suspicious nodule should be considered based on TI-RADS criteria.  _________________________________________________________  Loraine Maple approximately 3.3 x 2.6 x 1.9 cm isoechoic ill-defined nodule within the mid aspect the left lobe of the thyroid (labeled 4) is favored to represent a pseudonodule as it lacks defined borders on both provided transverse and sagittal images.  _________________________________________________________  Nodule # 5:  Location: Left; Mid  Maximum size: 4.9 cm; Other 2 dimensions: 4.2 x 4.0 cm  Composition: solid/almost completely solid (2)  Echogenicity: isoechoic (1)  Shape: not taller-than-wide (0)  Margins: ill-defined (0)  Echogenic foci: none (0)  ACR TI-RADS total points: 3.  ACR TI-RADS risk category: TR3 (3 points).  ACR TI-RADS recommendations:  **Given size (>/= 2.5 cm) and appearance, fine needle aspiration of this mildly suspicious nodule should be considered based on TI-RADS criteria.  _________________________________________________________  IMPRESSION: 1. Thyromegaly with findings suggestive of multinodular goiter. 2. Nodules #2, #3 and #5 all meet imaging criteria to recommend percutaneous sampling. Current imaging recommendations are to only biopsy the two most worrisome nodules at a given time. As such, nodules #3 and #5 could undergo ultrasound-guided fine-needle aspiration as indicated.  FNA 01/15/2020 Clinical History: Location: Left; Mid, Maximum Size: 4.9cm; Other 2  dimensions: 4.2 x 4.0 cm, solid/almost completely solid (2), Isoechoic  (1), ACR TI-RADS Total Points 3.  Specimen Submitted: A. THYROID, LEFT LOBE MID, FINE NEEDLE ASPIRATION:     FNA 01/15/2020  FINAL MICROSCOPIC DIAGNOSIS:  - Scant follicular epithelium present (Bethesda category I)   Clinical History: Location: Left; Superior, Maximum Size: 1.7 cm; Other  2 dimensions: 1.3 x 1.3 cm, solid/almost completely solid (2),   Hypoechoic (2), ACR-TI RADS Total Points 4.  Specimen Submitted: A. THYROID, LEFT LOBE SUP, FINE NEEDLE ASPIRATION:    FINAL MICROSCOPIC DIAGNOSIS:  - Scant follicular epithelium present (Bethesda category I)    Old records , labs and images have been reviewed.   ASSESSMENT/PLAN/RECOMMENDATIONS:   1. Multinodular Goiter :   - Pt is clinically euthyroid  - No local neck symptoms - We have reviewed his ultrasound results with right nodule as well as left superior and left mid nodules meeting FNA criteria - He is S/P left superior and left mid FNA with insufficient cellularity - I have recommend proceeding with FNA of right nodule as well as repeating left thyroid nodules FNA. It is recommended to wait ~ 3 months from the prior attempt - we also discussed options such as total thyroidectomy, I explained to him he would require LT-4 replacement for life with total  thyroidectomy.  - Will proceed with FNA as above   F/U in 6 months   Signed electronically by: Mack Guise, MD  [facility]  [facility] [address] [place], [place] [address] Phone: [phone] FAX: [phone]   CC: Colon Branch, [address] [place] [address] Phone: [phone] Fax: [phone]   Return to Endocrinology clinic as below: Future Appointments  Date Time Provider [place]  08/05/2020  3:00 PM Verlie Liotta, Melanie Crazier, MD [facility]  12/25/2020  8:00 AM Colon Branch, MD [facility]

## 2020-02-15 ENCOUNTER — Other Ambulatory Visit: Payer: Self-pay | Admitting: Internal Medicine

## 2020-02-15 MED ORDER — SILDENAFIL CITRATE 20 MG PO TABS: 60.0000 mg | ORAL_TABLET | Freq: Every evening | ORAL | 3 refills | Status: DC | PRN

## 2020-02-15 NOTE — Telephone Encounter (Signed)
E-scribe down. Rx faxed to [facility] in [place]. Fax confirmation received.

## 2020-02-15 NOTE — Addendum Note (Signed)
Addended byDamita Dunnings D on: 02/15/2020 10:12 AM   Modules accepted: Orders

## 2020-03-10 ENCOUNTER — Telehealth: Payer: Self-pay

## 2020-03-10 NOTE — Telephone Encounter (Signed)
Pt is needing a appointment due to a sinus infection. Pt denied triage and was provided with office hours.   Telephone: [phone]

## 2020-03-10 NOTE — Telephone Encounter (Signed)
LMOM informing Pt to return call if still needing appt.

## 2020-03-12 ENCOUNTER — Telehealth: Payer: 59 | Admitting: Family Medicine

## 2020-03-12 ENCOUNTER — Telehealth (INDEPENDENT_AMBULATORY_CARE_PROVIDER_SITE_OTHER): Payer: 59 | Admitting: Family Medicine

## 2020-03-12 ENCOUNTER — Other Ambulatory Visit: Payer: Self-pay

## 2020-03-12 ENCOUNTER — Encounter: Payer: Self-pay | Admitting: Family Medicine

## 2020-03-12 VITALS — Temp 99.0°F

## 2020-03-12 DIAGNOSIS — J0100 Acute maxillary sinusitis, unspecified: Secondary | ICD-10-CM

## 2020-03-12 MED ORDER — PREDNISONE 20 MG PO TABS: 40.0000 mg | ORAL_TABLET | Freq: Every day | ORAL | 0 refills | Status: AC

## 2020-03-12 MED ORDER — AMOXICILLIN-POT CLAVULANATE 875-125 MG PO TABS: 1.0000 | ORAL_TABLET | Freq: Two times a day (BID) | ORAL | 0 refills | Status: AC

## 2020-03-12 NOTE — Progress Notes (Signed)
Chief Complaint  Patient presents with  . Sinusitis    Trevor Hurst here for URI complaints. Due to COVID-19 pandemic, we are interacting via web portal for an electronic face-to-face visit. I verified patient's ID using 2 identifiers. Patient agreed to proceed with visit via this method. Patient is at work, I am at office. Patient and I are present for visit.   Duration: 4 days  Associated symptoms: sinus headache, sinus congestion, sinus pain and rhinorrhea Denies: ear pain, ear drainage, sore throat, wheezing, shortness of breath, myalgia and fevers Treatment to date: Augmentin, Motrin Sick contacts: No  Had dental work, placed on above med, feels better, but worries 5 d course will not be enough.   Past Medical History:  Diagnosis Date  . Anxiety   . Complication of anesthesia   . Hyperlipidemia   . PONV (postoperative nausea and vomiting)     Temp 99 F (37.2 C) (Oral)  No conversational dyspnea Age appropriate judgment and insight Nml affect and mood  Acute non-recurrent maxillary sinusitis - Plan: predniSONE (DELTASONE) 20 MG tablet, amoxicillin-clavulanate (AUGMENTIN) 875-125 MG tablet  Will extend abx, though he may not need it. He is more concerned that he will have issues over weekend. Can start pred burst 40 mg/d for 5 d if still having s/s's tomorrow.  F/u Mon or Tues if no better, in person as he will require an exam. Pt voiced understanding and agreement to the plan.  [place], DO 03/12/20 11:17 AM

## 2020-03-18 ENCOUNTER — Telehealth: Payer: Self-pay | Admitting: Internal Medicine

## 2020-03-18 NOTE — Telephone Encounter (Signed)
Patient informed of instructions. He has scheduled appointment for tomorrow (03/19/20) at 12:45.

## 2020-03-18 NOTE — Telephone Encounter (Signed)
Finish the abx and let me know if we aren't better. 10 day courses are common, not longer. If no better, I want him seen to ensure correct dx. Ty.

## 2020-03-18 NOTE — Telephone Encounter (Signed)
I had sent in 5 more days of the Augmentin that he should still be taking, did he get it?

## 2020-03-18 NOTE — Telephone Encounter (Signed)
Good Morning, Patient states he is still having his sinus symptoms that [place] treated him for last week. He states he was told if he wasn't better to call back for an appt on Monday after being treated with Antibiotics with slow results

## 2020-03-18 NOTE — Telephone Encounter (Signed)
Called him and he is taking the 5 days has 2 days left. He is better. But has headaches//drainage has a bad smell. He wants to make sure is this normal--8 days on augmentin. Prednisone he completed yesterday. Is there anything else he should be doing.

## 2020-03-19 ENCOUNTER — Other Ambulatory Visit: Payer: Self-pay

## 2020-03-19 ENCOUNTER — Encounter: Payer: Self-pay | Admitting: Family Medicine

## 2020-03-19 ENCOUNTER — Ambulatory Visit (INDEPENDENT_AMBULATORY_CARE_PROVIDER_SITE_OTHER): Payer: 59 | Admitting: Family Medicine

## 2020-03-19 VITALS — BP 142/88 | HR 96 | Temp 98.1°F | Ht 72.0 in | Wt 223.0 lb

## 2020-03-19 DIAGNOSIS — J0100 Acute maxillary sinusitis, unspecified: Secondary | ICD-10-CM | POA: Diagnosis not present

## 2020-03-19 MED ORDER — FLUTICASONE PROPIONATE 50 MCG/ACT NA SUSP: 2.0000 | Freq: Every day | NASAL | 2 refills | Status: DC

## 2020-03-19 NOTE — Patient Instructions (Addendum)
Use an air humidifier when able.  Consider Afrin nasal spray daily for 3 days. Start Flonase after that.   Consider Netty Pot or a Milta Deiters Med bottle for more pressure for saline rinses.  I don't think we need any more antibiotics.  Ok to continue the [employer]. You probably don't need to use it on the Afrin.   Let us know if you need anything.

## 2020-03-19 NOTE — Progress Notes (Signed)
Chief Complaint  Patient presents with  . Sinusitis    Subjective: Patient is a 59 y.o. male here for f/u.  Tx'd for sinus infection last week w pred and Augmentin. Feeling much better. Having drainage with a strange odor. Tried saline spray and Sudafed with little relief. No fevers, sinus pain, ear pain/drainage, eye itching/drainage, cough, wheezing, sob, myalgias, N/V. Wants to know what he can use for congestion and drainage.   Past Medical History:  Diagnosis Date  . Anxiety   . Complication of anesthesia   . Hyperlipidemia   . PONV (postoperative nausea and vomiting)     Objective: BP (!) 142/88 (BP Location: Left Arm, Patient Position: Sitting, Cuff Size: Normal)   Pulse 96   Temp 98.1 F (36.7 C) (Oral)   Ht 6' (1.829 m)   Wt 223 lb (101.2 kg)   SpO2 98%   BMI 30.24 kg/m  General: Awake, appears stated age HEENT: MMM, drainage noted in pharynx, no erythema or exudate, EOMi, ears neg, nares patent, no sinus ttp Heart: RRR Lungs: CTAB, no rales, wheezes or rhonchi. No accessory muscle use Psych: Age appropriate judgment and insight, normal affect and mood  Assessment and Plan: Acute maxillary sinusitis, recurrence not specified - Plan: fluticasone (FLONASE) 50 MCG/ACT nasal spray  Flonase, Afrin for 3 d, salt water gargles, [employer].  BP noted, he is on Sudafed. Dr. Larose Kells not concerned with BP otherwise. We briefly discussed this.  F/u prn.  The patient voiced understanding and agreement to the plan.  [place], DO 03/19/20  1:06 PM

## 2020-05-08 ENCOUNTER — Ambulatory Visit
Admission: RE | Admit: 2020-05-08 | Discharge: 2020-05-08 | Disposition: A | Discharge status: Home / Self Care | Payer: 59 | Source: Ambulatory Visit | Attending: Internal Medicine | Admitting: Internal Medicine

## 2020-05-08 ENCOUNTER — Other Ambulatory Visit: Payer: Self-pay | Admitting: Student

## 2020-05-08 ENCOUNTER — Other Ambulatory Visit (HOSPITAL_COMMUNITY)
Admission: RE | Admit: 2020-05-08 | Discharge: 2020-05-08 | Disposition: A | Discharge status: Home / Self Care | Payer: 59 | Source: Ambulatory Visit | Attending: Internal Medicine | Admitting: Internal Medicine

## 2020-05-08 DIAGNOSIS — E042 Nontoxic multinodular goiter: Secondary | ICD-10-CM | POA: Diagnosis not present

## 2020-05-12 LAB — CYTOLOGY - NON PAP

## 2020-05-14 ENCOUNTER — Encounter: Payer: Self-pay | Admitting: Internal Medicine

## 2020-05-14 NOTE — Telephone Encounter (Signed)
I have discussed the results of cytology report with the pt on 05/14/2020 at 1400    Right thyroid nodule- benign    Left superior nodule - Atypia with undetermined significance - awaiting Afirma ( previously biopsied with scan cellularity 01/2020 )     Left mid nodule - scan cellularity - previously biopsied with scan cellularity 01/2020)   Awaiting on Afirma and will determine if surgery is warranted at the time or do we continue with serial ultrasound     Pt expressed understanding    Abby Nena Jordan, MD  [facility] Endocrinology  [facility] [address] [place], [place] [address] Phone: [phone] FAX: [phone]

## 2020-05-23 ENCOUNTER — Telehealth: Payer: Self-pay | Admitting: Internal Medicine

## 2020-05-23 NOTE — Telephone Encounter (Signed)
Can you please call the cytology department at [phone] / [phone] and check on the status of Afirma results for his left superior nodule ?  It was done 1/6th    Thanks   Abby Nena Jordan, MD  [facility] Endocrinology  [facility] [address] [place], [place] [address] Phone: [phone] FAX: [phone]

## 2020-05-23 NOTE — Telephone Encounter (Signed)
Spoken to [facility] at the Cytology department. I was told that it was sent to Afirma since beginning of last week. It should be resulted in the next 1 to 2 weeks.

## 2020-05-27 ENCOUNTER — Encounter (HOSPITAL_COMMUNITY): Payer: Self-pay

## 2020-06-03 ENCOUNTER — Telehealth: Payer: Self-pay | Admitting: Internal Medicine

## 2020-06-03 NOTE — Telephone Encounter (Signed)
Discussed Afirma results with the pt on 06/03/2020 at 7:45 Am.    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     Pt would like to think about this. We discussed small risk of thyroid cancer, we also discussed risk of thyroidectomy .   Will discuss during his office visit in 08/2020   Mack Guise, MD  [facility] Endocrinology  [facility] [address] [place], [place] [address] Phone: [phone] FAX: [phone]

## 2020-07-09 ENCOUNTER — Telehealth: Payer: Self-pay | Admitting: Internal Medicine

## 2020-07-09 ENCOUNTER — Encounter: Payer: Self-pay | Admitting: Internal Medicine

## 2020-07-09 NOTE — Telephone Encounter (Signed)
Requesting: lorazepam 0.5mg  Contract: 05/27/2017 UDS: 12/25/2018 Last Visit: 12/27/2019 Next Visit: 12/25/2020 Last Refill: 01/14/2020 #30 and 5RF  Please Advise

## 2020-07-09 NOTE — Telephone Encounter (Signed)
PDMP okay, Rx sent 

## 2020-07-10 NOTE — Telephone Encounter (Signed)
Can you please call and figure out what the issue is? I am not sure when I can call him and since he said this can't wait . I'd hate for him to wait on my call when I am not sure when I will have the time.   I will be happy to write a letter , he just needs to tell me what its about ? Is it health issue ? is it billing issue ? Because if its a billing issue , he will have to contact the billing department.   His molecular Afirma testing came back benign ( not suspicious )

## 2020-07-10 NOTE — Telephone Encounter (Signed)
Pt called back- stated he will send a MyChart message with detailed information to what he wants in the letter

## 2020-07-10 NOTE — Telephone Encounter (Signed)
Message left for patient to return my call.  

## 2020-07-14 ENCOUNTER — Encounter: Payer: Self-pay | Admitting: Internal Medicine

## 2020-07-14 NOTE — Telephone Encounter (Signed)
Trevor Hurst, please let him know I wrote a letter, it was sent to him through the portal.   Check with him to see if he needs it faxed? Or will he take it to them ?   Thanks

## 2020-08-05 ENCOUNTER — Encounter: Payer: Self-pay | Admitting: Internal Medicine

## 2020-08-05 ENCOUNTER — Other Ambulatory Visit: Payer: Self-pay

## 2020-08-05 ENCOUNTER — Ambulatory Visit (INDEPENDENT_AMBULATORY_CARE_PROVIDER_SITE_OTHER): Payer: 59 | Admitting: Internal Medicine

## 2020-08-05 VITALS — BP 136/86 | HR 90 | Ht 72.0 in | Wt 222.1 lb

## 2020-08-05 DIAGNOSIS — E042 Nontoxic multinodular goiter: Secondary | ICD-10-CM | POA: Diagnosis not present

## 2020-08-05 NOTE — Progress Notes (Signed)
Name: Trevor Hurst  MRN/ DOB: [id number], Oct 13, 1960    Age/ Sex: 60 y.o., male     PCP: Colon Branch, MD   Reason for Endocrinology Evaluation: [place]     Initial Endocrinology Clinic Visit: 02/05/2020    PATIENT IDENTIFIER: Trevor Hurst is a 60 y.o., male with a past medical history of MNG. He has followed with [facility] since 02/05/2020 for consultative assistance with management of his MNG  HISTORICAL SUMMARY:   Pt was noted to have thyromegaly on exam in 12/2019 which prompted a thyroid ultrasound revealing MNG. Pt has a right inferior nodule ( 2.9 cm) and a left superior 1.7 cm as well as  Left mid nodule 4.9 cm , meeting criteria for FNA.   An attempt to biopsy of left superior and mid nodules in 01/2020 showing scan cellularity. ( Bethesda Category I)  No FH of thyroid disease  SUBJECTIVE:    Today (08/05/2020):  Trevor Hurst is here for a follow up on MNG.   Denies local neck swelling, pain or dysphagia Denies constipation or diarrhea  Denies depression    HISTORY:  Past Medical History:  Past Medical History:  Diagnosis Date  . Anxiety   . Complication of anesthesia   . Hyperlipidemia   . PONV (postoperative nausea and vomiting)    Past Surgical History:  Past Surgical History:  Procedure Laterality Date  . HERNIA REPAIR  12 yrs ago   R   . INGUINAL HERNIA REPAIR  12/24/2011   Procedure: LAPAROSCOPIC BILATERAL INGUINAL HERNIA REPAIR;  Surgeon: Gayland Curry, MD,FACS;  Location: [facility];  Service: General;;  DR Redmond Pulling SPOKE WITH PT WIFE ABOUT REPAIRING THE UMBILICAL AND LEFT INGUINAL HERNIA BOTH WITH MESH .  ALL  RISKS WERE DISCUSSED WITH  HER OVER THE PHONE.  WIFE INSTRUCTED HIM TO PROCEED WITH REPAIRING THE LEFT INGUINAL AND UMBILICAL HERNIA   . INSERTION OF MESH N/A 02/24/2016   Procedure: INSERTION OF MESH;  Surgeon: Greer Pickerel, MD;  Location: [facility];  Service: General;  Laterality: N/A;  . UMBILICAL HERNIA REPAIR  12/24/2011   Procedure:  HERNIA REPAIR UMBILICAL ADULT;  Surgeon: Gayland Curry, MD,FACS;  Location: [facility];  Service: General;  Laterality: N/A;  . VENTRAL HERNIA REPAIR N/A 02/24/2016   Procedure: LAPAROSCOPIC ASSISTED REPAIR OF SUPRAUMBILICAL  HERNIA WITH MESH;  Surgeon: Greer Pickerel, MD;  Location: [facility];  Service: General;  Laterality: N/A;  . warts  12/23/11   dermatologist froze them on back calfs bilaterally    Social History:  reports that he quit smoking about 9 years ago. He has a 10.00 pack-year smoking history. He has never used smokeless tobacco. He reports that he does not drink alcohol and does not use drugs. Family History:  Family History  Problem Relation Age of Onset  . Lung cancer Father        was a smoker  . Transient ischemic attack Mother   . Depression Sister   . Diabetes Paternal Grandfather   . Heart disease Neg Hx   . Colon cancer Neg Hx   . Prostate cancer Neg Hx      HOME MEDICATIONS: Allergies as of 08/05/2020      Reactions   Oxycodone    With last surgery took one pain pill and slept for 14 hours and woke up in a sweat       Medication List       Accurate as of August 05, 2020  4:45 PM. If you have any questions, ask your nurse or doctor.        STOP taking these medications   fluticasone 50 MCG/ACT nasal spray Commonly known as: FLONASE Stopped by: Dorita Sciara, MD     TAKE these medications   b complex vitamins capsule Take 1 capsule by mouth daily with lunch.   clobetasol ointment 0.05 % Commonly known as: TEMOVATE Apply 1 application topically every other day.   fexofenadine 180 MG tablet Commonly known as: ALLEGRA Take 180 mg by mouth daily.   ibuprofen 200 MG tablet Commonly known as: ADVIL Take 400 mg by mouth every 6 (six) hours as needed (for pain.).   LORazepam 0.5 MG tablet Commonly known as: ATIVAN TAKE 1 TABLET (0.5 MG TOTAL) BY MOUTH AT BEDTIME AS NEEDED FOR SLEEP.   nicotine polacrilex 4 MG gum Commonly known as:  NICORETTE Take 4 mg by mouth 4 (four) times daily as needed for smoking cessation.   sildenafil 20 MG tablet Commonly known as: REVATIO Take 3-4 tablets (60-80 mg total) by mouth at bedtime as needed.   Vitamin D-3 25 MCG (1000 UT) Caps Take 1 capsule by mouth daily.         OBJECTIVE:   PHYSICAL EXAM: VS: BP 136/86   Pulse 90   Ht 6' (1.829 m)   Wt 222 lb 2 oz (100.8 kg)   SpO2 98%   BMI 30.13 kg/m    EXAM: General: Pt appears well and is in NAD  Hydration: Well-hydrated with moist mucous membranes and good skin turgor  Eyes: External eye exam normal without stare, lid lag or exophthalmos.  EOM intact.  PERRL.  Ears, Nose, Throat: Hearing: Grossly intact bilaterally Dental: Good dentition  Throat: Clear without mass, erythema or exudate  Neck: General: Supple without adenopathy. Thyroid: Thyroid size normal.  No goiter or nodules appreciated. No thyroid bruit.  Lungs: Clear with good BS bilat with no rales, rhonchi, or wheezes  Heart: Auscultation: RRR.  Abdomen: Normoactive bowel sounds, soft, nontender, without masses or organomegaly palpable  Extremities: Gait and station: Normal gait  Digits and nails: No clubbing, cyanosis, petechiae, or nodes Head and neck: Normal alignment and mobility BL UE: Normal ROM and strength. BL LE: No pretibial edema normal ROM and strength.  Skin: Hair: Texture and amount normal with gender appropriate distribution Skin Inspection: No rashes, acanthosis nigricans/skin tags. No lipohypertrophy Skin Palpation: Skin temperature, texture, and thickness normal to palpation  Neuro: Cranial nerves: II - XII grossly intact  Cerebellar: Normal coordination and movement; no tremor Motor: Normal strength throughout DTRs: 2+ and symmetric in UE without delay in relaxation phase  Mental Status: Judgment, insight: Intact Orientation: Oriented to time, place, and person Memory: Intact for recent and remote events Mood and affect: No depression,  anxiety, or agitation     DATA REVIEWED:   Results for Trevor, Hurst (MRN [id number]) as of 08/05/2020 13:02  Ref. Range 12/27/2019 08:52  TSH Latest Ref Range: 0.40 - 4.50 mIU/L 0.51    FNA right mid nodule 2.9 cm 05/08/2020  FINAL MICROSCOPIC DIAGNOSIS:  - Consistent with benign follicular nodule (Bethesda category II)     FNA left mid nodule 4.9 cm 05/08/2020  FINAL MICROSCOPIC DIAGNOSIS:  - Scant follicular epithelium present (Bethesda category I)     FNA Left superior nodule 1.7 cm 05/08/2020  FINAL MICROSCOPIC DIAGNOSIS:  - Atypia of undetermined significance (Bethesda category III)   ASSESSMENT / PLAN /  RECOMMENDATIONS:   1. Multinodular Goiter:   -Patient is clinically and biochemically euthyroid -No local neck symptoms -He is status post benign FNA of the right mid thyroid nodule 2.9 cm and 05/2020, he had a TPO on FNA of the left superior thyroid nodule with benign molecular testing, reassurance has been provided for both of these nodules -He is status post 2 FNAs of the left inferior thyroid nodule 4.9 cm, patient understands that we are unable to confirm the presence of the absence of any thyroid cancer on this nodule. -We discussed options of total thyroidectomy versus short-term follow-up.  -We have agreed on proceeding with a short-term follow-up to ultrasound, we will have a low threshold and proceeding with total thyroidectomy   Follow-up in 6 months   Signed electronically by: Mack Guise, MD  [facility] Endocrinology  [facility] [address] [place], [place] [address] Phone: [phone] FAX: [phone]      CC: Colon Branch, MD [address] [place] [address] Phone: [phone]  Fax: [phone]   Return to Endocrinology clinic as below: Future Appointments  Date Time Provider [place]  12/25/2020  8:00 AM Colon Branch, MD [facility] [place]

## 2020-09-22 ENCOUNTER — Encounter: Payer: Self-pay | Admitting: Internal Medicine

## 2020-09-26 ENCOUNTER — Ambulatory Visit (INDEPENDENT_AMBULATORY_CARE_PROVIDER_SITE_OTHER): Payer: 59 | Admitting: Internal Medicine

## 2020-09-26 ENCOUNTER — Encounter: Payer: Self-pay | Admitting: Internal Medicine

## 2020-09-26 ENCOUNTER — Other Ambulatory Visit: Payer: Self-pay

## 2020-09-26 VITALS — BP 160/80 | Temp 97.6°F | Resp 16 | Ht 72.0 in | Wt 216.0 lb

## 2020-09-26 DIAGNOSIS — R030 Elevated blood-pressure reading, without diagnosis of hypertension: Secondary | ICD-10-CM | POA: Diagnosis not present

## 2020-09-26 DIAGNOSIS — N529 Male erectile dysfunction, unspecified: Secondary | ICD-10-CM | POA: Diagnosis not present

## 2020-09-26 DIAGNOSIS — L03312 Cellulitis of back [any part except buttock]: Secondary | ICD-10-CM

## 2020-09-26 MED ORDER — SILDENAFIL CITRATE 20 MG PO TABS: 60.0000 mg | ORAL_TABLET | Freq: Every evening | ORAL | 3 refills | Status: DC | PRN

## 2020-09-26 MED ORDER — CEPHALEXIN 500 MG PO CAPS: 500.0000 mg | ORAL_CAPSULE | Freq: Four times a day (QID) | ORAL | 0 refills | Status: DC

## 2020-09-26 NOTE — Assessment & Plan Note (Signed)
  Cellulitis: Early cellulitis as described above, recommend Keflex x5 days, use clobetasol as needed, definitely call if not better Elevated BP?  BP today's is elevated, was okay few days ago at another office.  Recommend to check BPs, see AVS. ED: Refill sildenafil

## 2020-09-26 NOTE — Patient Instructions (Signed)
Take the antibiotic for 5 days  Apply clobetasol to the area twice daily  If is not gradually better let me know.  Your blood pressure is a little high today, please check weekly for the next few weeks.  If it is more than 140/85 consistently let me know.

## 2020-09-26 NOTE — Progress Notes (Signed)
Subjective:    Patient ID: Trevor Hurst, male    DOB: 12/13/1960, 60 y.o.   MRN: [id number]  DOS:  09/26/2020 Type of visit - description: Acute visit About 4 days ago, noted a itchy place at the back, he put some hydrocortisone cream there with some help. He is here because the area is not improving. Denies pain or discharge.  No fever chills. No nausea or vomiting.  I also noticed his blood pressure to be slightly elevated.  At her recent visit to endocrinology, it was okay.  Request a refill on Viagra  BP Readings from Last 3 Encounters:  09/26/20 (!) 160/80  08/05/20 136/86  03/19/20 (!) 142/88     Review of Systems See above   Past Medical History:  Diagnosis Date  . Anxiety   . Complication of anesthesia   . Hyperlipidemia   . PONV (postoperative nausea and vomiting)     Past Surgical History:  Procedure Laterality Date  . HERNIA REPAIR  12 yrs ago   R   . INGUINAL HERNIA REPAIR  12/24/2011   Procedure: LAPAROSCOPIC BILATERAL INGUINAL HERNIA REPAIR;  Surgeon: Gayland Curry, MD,FACS;  [facility]: [facility];  Service: General;;  DR Redmond Pulling SPOKE WITH PT WIFE ABOUT REPAIRING THE UMBILICAL AND LEFT INGUINAL HERNIA BOTH WITH MESH .  ALL  RISKS WERE DISCUSSED WITH  HER OVER THE PHONE.  WIFE INSTRUCTED HIM TO PROCEED WITH REPAIRING THE LEFT INGUINAL AND UMBILICAL HERNIA   . INSERTION OF MESH N/A 02/24/2016   Procedure: INSERTION OF MESH;  Surgeon: Greer Pickerel, MD;  Location: [facility];  Service: General;  Laterality: N/A;  . UMBILICAL HERNIA REPAIR  12/24/2011   Procedure: HERNIA REPAIR UMBILICAL ADULT;  Surgeon: Gayland Curry, MD,FACS;  Location: [facility];  Service: General;  Laterality: N/A;  . VENTRAL HERNIA REPAIR N/A 02/24/2016   Procedure: LAPAROSCOPIC ASSISTED REPAIR OF SUPRAUMBILICAL  HERNIA WITH MESH;  Surgeon: Greer Pickerel, MD;  Location: [facility];  Service: General;  Laterality: N/A;  . warts  12/23/11   dermatologist froze them on back calfs bilaterally    Allergies as  of 09/26/2020      Reactions   Oxycodone    With last surgery took one pain pill and slept for 14 hours and woke up in a sweat       Medication List       Accurate as of Sep 26, 2020  8:02 AM. If you have any questions, ask your nurse or doctor.        b complex vitamins capsule Take 1 capsule by mouth daily with lunch.   clobetasol ointment 0.05 % Commonly known as: TEMOVATE Apply 1 application topically every other day.   fexofenadine 180 MG tablet Commonly known as: ALLEGRA Take 180 mg by mouth daily.   ibuprofen 200 MG tablet Commonly known as: ADVIL Take 400 mg by mouth every 6 (six) hours as needed (for pain.).   LORazepam 0.5 MG tablet Commonly known as: ATIVAN TAKE 1 TABLET (0.5 MG TOTAL) BY MOUTH AT BEDTIME AS NEEDED FOR SLEEP.   nicotine polacrilex 4 MG gum Commonly known as: NICORETTE Take 4 mg by mouth 4 (four) times daily as needed for smoking cessation.   sildenafil 20 MG tablet Commonly known as: REVATIO Take 3-4 tablets (60-80 mg total) by mouth at bedtime as needed.   Vitamin D-3 25 MCG (1000 UT) Caps Take 1 capsule by mouth daily.  Objective:   Physical Exam BP (!) 160/80 (BP Location: Right Arm, Patient Position: Sitting, Cuff Size: Normal)   Temp 97.6 F (36.4 C)   Resp 16   Ht 6' (1.829 m)   Wt 216 lb (98 kg)   BMI 29.29 kg/m  General:   Well developed, NAD, BMI noted. HEENT:  Normocephalic . Face symmetric, atraumatic Skin: At day right side of the mid back has area of induration, mild redness and warmness.  No abscess that I can tell, no discharge. neurologic:  alert & oriented X3.  Speech normal, gait appropriate for age and unassisted Psych--  Cognition and judgment appear intact.  Cooperative with normal attention span and concentration.  Behavior appropriate. No anxious or depressed appearing.        Assessment    ASSESSMENT Anxiety, insomnia -- diazepam qhs, also uses nicotine gum at night Nicotine use  gum but no tobacco per se Hyperlipidemia Psoriasis (per derm) -- clobetasol per derm Pre-ca skin lesion R knee  Multinodular goiter, follow-up Endo   PLAN: Cellulitis: Early cellulitis as described above, recommend Keflex x5 days, use clobetasol as needed, definitely call if not better Elevated BP?  BP today's is elevated, was okay few days ago at another office.  Recommend to check BPs, see AVS. ED: Refill sildenafil.     This visit occurred during the SARS-CoV-2 public health emergency.  Safety protocols were in place, including screening questions prior to the visit, additional usage of staff PPE, and extensive cleaning of exam room while observing appropriate contact time as indicated for disinfecting solutions.

## 2020-12-06 ENCOUNTER — Telehealth: Payer: Self-pay | Admitting: Internal Medicine

## 2020-12-08 NOTE — Telephone Encounter (Signed)
Requesting: lorazepam 0.'5mg'$   Contract: 05/27/2017 UDS: 12/25/2018 Last Visit: 09/26/2020 Next Visit: 12/17/2020 Last Refill: 07/09/2020 #30 and 4RF  Please Advise

## 2020-12-08 NOTE — Telephone Encounter (Signed)
PDMP okay, Rx sent 

## 2020-12-10 ENCOUNTER — Other Ambulatory Visit: Payer: Self-pay

## 2020-12-10 ENCOUNTER — Ambulatory Visit (INDEPENDENT_AMBULATORY_CARE_PROVIDER_SITE_OTHER): Payer: 59

## 2020-12-10 DIAGNOSIS — E042 Nontoxic multinodular goiter: Secondary | ICD-10-CM

## 2020-12-15 ENCOUNTER — Telehealth: Payer: Self-pay | Admitting: Internal Medicine

## 2020-12-15 NOTE — Telephone Encounter (Signed)
Left a message for the patient to discuss thyroid ultrasound results on 12/15/2020 at [place], MD  [facility] Endocrinology  [facility] [address] [place], [place] [address] Phone: [phone] FAX: [phone]

## 2020-12-17 ENCOUNTER — Other Ambulatory Visit: Payer: Self-pay

## 2020-12-17 ENCOUNTER — Ambulatory Visit (INDEPENDENT_AMBULATORY_CARE_PROVIDER_SITE_OTHER): Payer: 59 | Admitting: Internal Medicine

## 2020-12-17 ENCOUNTER — Encounter: Payer: Self-pay | Admitting: Internal Medicine

## 2020-12-17 VITALS — BP 128/84 | HR 88 | Temp 98.4°F | Resp 16 | Ht 72.0 in | Wt 216.0 lb

## 2020-12-17 DIAGNOSIS — Z79899 Other long term (current) drug therapy: Secondary | ICD-10-CM | POA: Diagnosis not present

## 2020-12-17 DIAGNOSIS — G4700 Insomnia, unspecified: Secondary | ICD-10-CM

## 2020-12-17 DIAGNOSIS — E785 Hyperlipidemia, unspecified: Secondary | ICD-10-CM | POA: Diagnosis not present

## 2020-12-17 DIAGNOSIS — Z0000 Encounter for general adult medical examination without abnormal findings: Secondary | ICD-10-CM | POA: Diagnosis not present

## 2020-12-17 MED ORDER — CYCLOBENZAPRINE HCL 10 MG PO TABS: 10.0000 mg | ORAL_TABLET | Freq: Every evening | ORAL | 1 refills | Status: DC | PRN

## 2020-12-17 MED ORDER — TADALAFIL 20 MG PO TABS: 10.0000 mg | ORAL_TABLET | ORAL | 1 refills | Status: DC | PRN

## 2020-12-17 NOTE — Patient Instructions (Signed)
Immunizations I recommended: COVID-vaccine #4 Flu shot every fall   Take either sildenafil or Cialis. Cialis is as needed only, half or 1 tablet no more frequent than every 48 hours  GO TO THE FRONT DESK, PLEASE SCHEDULE YOUR APPOINTMENTS Come back for blood work fasting at your earliest convenience  Come back for a physical exam in 1 year

## 2020-12-17 NOTE — Progress Notes (Signed)
Subjective:    Patient ID: Trevor Hurst, male    DOB: 1961/01/18, 60 y.o.   MRN: [id number]  DOS:  12/17/2020 Type of visit - description: CPX  Here for CPX.  Wt Readings from Last 3 Encounters:  12/17/20 216 lb (98 kg)  09/26/20 216 lb (98 kg)  08/05/20 222 lb 2 oz (100.8 kg)    Review of Systems Occasionally sildenafil does not work, alternative? Occasional low back pain, Flexeril RF?  Other than above, a 14 point review of systems is negative       Past Medical History:  Diagnosis Date   Anxiety    Complication of anesthesia    Hyperlipidemia    PONV (postoperative nausea and vomiting)     Past Surgical History:  Procedure Laterality Date   HERNIA REPAIR  12 yrs ago   R    INGUINAL HERNIA REPAIR  12/24/2011   Procedure: LAPAROSCOPIC BILATERAL INGUINAL HERNIA REPAIR;  Surgeon: Gayland Curry, MD,FACS;  Location: [facility];  Service: General;;  DR Redmond Pulling SPOKE WITH PT WIFE ABOUT REPAIRING THE UMBILICAL AND LEFT INGUINAL HERNIA BOTH WITH MESH .  ALL  RISKS WERE DISCUSSED WITH  HER OVER THE PHONE.  WIFE INSTRUCTED HIM TO PROCEED WITH REPAIRING THE LEFT INGUINAL AND UMBILICAL HERNIA    INSERTION OF MESH N/A 02/24/2016   Procedure: INSERTION OF MESH;  Surgeon: Greer Pickerel, MD;  Location: [facility];  Service: General;  Laterality: N/A;   UMBILICAL HERNIA REPAIR  12/24/2011   Procedure: HERNIA REPAIR UMBILICAL ADULT;  Surgeon: Gayland Curry, MD,FACS;  Location: [facility];  Service: General;  Laterality: N/A;   VENTRAL HERNIA REPAIR N/A 02/24/2016   Procedure: LAPAROSCOPIC ASSISTED REPAIR OF SUPRAUMBILICAL  HERNIA WITH MESH;  Surgeon: Greer Pickerel, MD;  Location: [facility];  Service: General;  Laterality: N/A;   warts  12/23/11   dermatologist froze them on back calfs bilaterally   Social History   Socioeconomic History   Marital status: Married    Spouse name: Debbie   Number of children: 2   Years of education: Not on file   Highest education level: Not on file  Occupational  History   Occupation: IT    Employer: [employer]  Tobacco Use   Smoking status: Former    Packs/day: 1.00    Years: 10.00    Pack years: 10.00    Types: Cigarettes    Quit date: 04/30/2011    Years since quitting: 9.6   Smokeless tobacco: Never   Tobacco comments:    smoked 1979-2012 , ~ 1 ppd.Smoked total 29 years.  Substance and Sexual Activity   Alcohol use: No    Alcohol/week: 1.0 standard drink    Types: 1 Standard drinks or equivalent per week   Drug use: No   Sexual activity: Not on file  Other Topics Concern   Not on file  Social History Narrative   Lives w/ wife;   2 children graduated   Social Determinants of Health   Financial Resource Strain: Not on file  Food Insecurity: Not on file  Transportation Needs: Not on file  Physical Activity: Not on file  Stress: Not on file  Social Connections: Not on file  Intimate Partner Violence: Not on file     Allergies as of 12/17/2020       Reactions   Oxycodone    With last surgery took one pain pill and slept for 14 hours and woke up in a sweat  Medication List        Accurate as of December 17, 2020 11:59 PM. If you have any questions, ask your nurse or doctor.          STOP taking these medications    cephALEXin 500 MG capsule Commonly known as: Keflex Stopped by: Kathlene November, MD   sildenafil 20 MG tablet Commonly known as: REVATIO Stopped by: Kathlene November, MD       TAKE these medications    b complex vitamins capsule Take 1 capsule by mouth daily with lunch.   clobetasol ointment 0.05 % Commonly known as: TEMOVATE Apply 1 application topically every other day.   cyclobenzaprine 10 MG tablet Commonly known as: FLEXERIL Take 1 tablet (10 mg total) by mouth at bedtime as needed for muscle spasms. Started by: Kathlene November, MD   fexofenadine 180 MG tablet Commonly known as: ALLEGRA Take 180 mg by mouth daily.   ibuprofen 200 MG tablet Commonly known as: ADVIL Take 400 mg by mouth every 6  (six) hours as needed (for pain.).   LORazepam 0.5 MG tablet Commonly known as: ATIVAN TAKE 1 TABLET (0.5 MG TOTAL) BY MOUTH AT BEDTIME AS NEEDED FOR SLEEP.   nicotine polacrilex 4 MG gum Commonly known as: NICORETTE Take 4 mg by mouth 4 (four) times daily as needed for smoking cessation.   tadalafil 20 MG tablet Commonly known as: CIALIS Take 0.5-1 tablets (10-20 mg total) by mouth every other day as needed for erectile dysfunction. Started by: Kathlene November, MD   Vitamin D-3 25 MCG (1000 UT) Caps Take 1 capsule by mouth daily.           Objective:   Physical Exam BP 128/84 (BP Location: Left Arm, Patient Position: Sitting, Cuff Size: Normal)   Pulse 88   Temp 98.4 F (36.9 C) (Oral)   Resp 16   Ht 6' (1.829 m)   Wt 216 lb (98 kg)   SpO2 98%   BMI 29.29 kg/m  General: Well developed, NAD, BMI noted Neck: + Enlarge thyroid gland, nontender, nonnodular   HEENT:  Normocephalic . Face symmetric, atraumatic Lungs:  CTA B Normal respiratory effort, no intercostal retractions, no accessory muscle use. Heart: RRR,  no murmur.  Abdomen:  Not distended, soft, non-tender. No rebound or rigidity.   Lower extremities: no pretibial edema bilaterally  Skin: Exposed areas without rash. Not pale. Not jaundice Neurologic:  alert & oriented X3.  Speech normal, gait appropriate for age and unassisted Strength symmetric and appropriate for age.  Psych: Cognition and judgment appear intact.  Cooperative with normal attention span and concentration.  Behavior appropriate. No anxious or depressed appearing.     Assessment     ASSESSMENT Anxiety, insomnia -- diazepam qhs, also uses nicotine gum at night Nicotine use gum but no tobacco per se Hyperlipidemia Psoriasis (per derm) -- clobetasol per derm Pre-ca skin lesion R knee  Multinodular goiter, follow-up Endo   PLAN: Here for CPX Anxiety, insomnia: On diazepam, UDS and contract today. Hyperlipidemia: Diet controlled,  labs Goiter: Per Endo, last [place] 12/10/2020 ED: Occasionally sildenafil fails, I agree to Rx Cialis as a trial.  He knows not to mix them.  See AVS Back pain: Occasional lumbalgia after working on his yard, uses Flexeril as needed, Rx sent. RTC: 1 year   This visit occurred during the SARS-CoV-2 public health emergency.  Safety protocols were in place, including screening questions prior to the visit, additional usage of staff PPE, and extensive  cleaning of exam room while observing appropriate contact time as indicated for disinfecting solutions.

## 2020-12-18 ENCOUNTER — Encounter: Payer: Self-pay | Admitting: Internal Medicine

## 2020-12-18 ENCOUNTER — Other Ambulatory Visit (INDEPENDENT_AMBULATORY_CARE_PROVIDER_SITE_OTHER): Payer: 59

## 2020-12-18 ENCOUNTER — Telehealth: Payer: Self-pay | Admitting: Internal Medicine

## 2020-12-18 DIAGNOSIS — Z79899 Other long term (current) drug therapy: Secondary | ICD-10-CM

## 2020-12-18 DIAGNOSIS — G4700 Insomnia, unspecified: Secondary | ICD-10-CM

## 2020-12-18 DIAGNOSIS — E042 Nontoxic multinodular goiter: Secondary | ICD-10-CM

## 2020-12-18 DIAGNOSIS — E785 Hyperlipidemia, unspecified: Secondary | ICD-10-CM | POA: Diagnosis not present

## 2020-12-18 LAB — COMPREHENSIVE METABOLIC PANEL
ALT: 41 U/L (ref 0–53)
AST: 31 U/L (ref 0–37)
Albumin: 4.1 g/dL (ref 3.5–5.2)
Alkaline Phosphatase: 82 U/L (ref 39–117)
BUN: 10 mg/dL (ref 6–23)
CO2: 30 mEq/L (ref 19–32)
Calcium: 9.1 mg/dL (ref 8.4–10.5)
Chloride: 102 mEq/L (ref 96–112)
Creatinine, Ser: 0.78 mg/dL (ref 0.40–1.50)
GFR: 97.99 mL/min (ref >60.00)
Glucose, Bld: 88 mg/dL (ref 70–99)
Potassium: 4.2 mEq/L (ref 3.5–5.1)
Sodium: 138 mEq/L (ref 135–145)
Total Bilirubin: 0.5 mg/dL (ref 0.2–1.2)
Total Protein: 6.6 g/dL (ref 6.0–8.3)

## 2020-12-18 LAB — CBC WITH DIFFERENTIAL/PLATELET
Basophils Absolute: 0 10*3/uL (ref 0.0–0.1)
Basophils Relative: 0.6 % (ref 0.0–3.0)
Eosinophils Absolute: 0.1 10*3/uL (ref 0.0–0.7)
Eosinophils Relative: 1.6 % (ref 0.0–5.0)
HCT: 46.5 % (ref 39.0–52.0)
Hemoglobin: 15.6 g/dL (ref 13.0–17.0)
Lymphocytes Relative: 21 % (ref 12.0–46.0)
Lymphs Abs: 1.2 10*3/uL (ref 0.7–4.0)
MCHC: 33.5 g/dL (ref 30.0–36.0)
MCV: 87.6 fl (ref 78.0–100.0)
Monocytes Absolute: 0.5 10*3/uL (ref 0.1–1.0)
Monocytes Relative: 9.2 % (ref 3.0–12.0)
Neutro Abs: 3.9 10*3/uL (ref 1.4–7.7)
Neutrophils Relative %: 67.6 % (ref 43.0–77.0)
Platelets: 166 10*3/uL (ref 150.0–400.0)
RBC: 5.31 Mil/uL (ref 4.22–5.81)
RDW: 13.6 % (ref 11.5–15.5)
WBC: 5.8 10*3/uL (ref 4.0–10.5)

## 2020-12-18 LAB — LIPID PANEL
Cholesterol: 210 mg/dL — ABNORMAL HIGH (ref 0–200)
HDL: 43.1 mg/dL (ref >39.00)
LDL Cholesterol: 151 mg/dL — ABNORMAL HIGH (ref 0–99)
NonHDL: 167.17
Total CHOL/HDL Ratio: 5
Triglycerides: 83 mg/dL (ref 0.0–149.0)
VLDL: 16.6 mg/dL (ref 0.0–40.0)

## 2020-12-18 LAB — TSH: TSH: 0.74 u[IU]/mL (ref 0.35–5.50)

## 2020-12-18 NOTE — Assessment & Plan Note (Signed)
-  Td 2016 - s/p shingrix - s/p covid vax x 3, rec #4 - flu shot recommended  -CCS cscope 11-2014, wnl , 10 years per report -Prostate cancer screening: DRE and PSA normal 12-2019  -Former smoker, + FH lung cancer, lung cancer screening benefits discussed, states will think about it. -Diet-exercise: Doing great, eating healthier, has lost some weight.  Changed his routine >>  exercising after work, that has relieves his stress significantly, sleeping better. -Labs: UDS, CMP, FLP, CBC, TSH  -POA: on file

## 2020-12-18 NOTE — Assessment & Plan Note (Signed)
Here for CPX Anxiety, insomnia: On diazepam, UDS and contract today. Hyperlipidemia: Diet controlled, labs Goiter: Per Endo, last [place] 12/10/2020 ED: Occasionally sildenafil fails, I agree to Rx Cialis as a trial.  He knows not to mix them.  See AVS Back pain: Occasional lumbalgia after working on his yard, uses Flexeril as needed, Rx sent. RTC: 1 year

## 2020-12-18 NOTE — Telephone Encounter (Signed)
I have discussed the thyroid ultrasound results with the patient on 12/18/2021 at 1312    I have recommended total thyroidectomy which she is in agreement of   He understands that he will be on lifelong LT-4 replacement   Abby Nena Jordan, MD  [facility]  [facility] [address] [place], [place] [address] Phone: [phone] FAX: [phone]

## 2020-12-21 LAB — DRUG MONITORING, PANEL 8 WITH CONFIRMATION, URINE
6 Acetylmorphine: NEGATIVE ng/mL (ref <10)
Alcohol Metabolites: NEGATIVE ng/mL (ref <500)
Alphahydroxyalprazolam: NEGATIVE ng/mL (ref <25)
Alphahydroxymidazolam: NEGATIVE ng/mL (ref <50)
Alphahydroxytriazolam: NEGATIVE ng/mL (ref <50)
Aminoclonazepam: NEGATIVE ng/mL (ref <25)
Amphetamines: NEGATIVE ng/mL (ref <500)
Benzodiazepines: POSITIVE ng/mL — AB (ref <100)
Buprenorphine, Urine: NEGATIVE ng/mL (ref <5)
Cocaine Metabolite: NEGATIVE ng/mL (ref <150)
Creatinine: 246.9 mg/dL (ref >=20.0)
Hydroxyethylflurazepam: NEGATIVE ng/mL (ref <50)
Lorazepam: 565 ng/mL — ABNORMAL HIGH (ref <50)
MDMA: NEGATIVE ng/mL (ref <500)
Marijuana Metabolite: NEGATIVE ng/mL (ref <20)
Nordiazepam: NEGATIVE ng/mL (ref <50)
Opiates: NEGATIVE ng/mL (ref <100)
Oxazepam: NEGATIVE ng/mL (ref <50)
Oxidant: NEGATIVE ug/mL (ref <200)
Oxycodone: NEGATIVE ng/mL (ref <100)
Temazepam: NEGATIVE ng/mL (ref <50)
pH: 5.8 (ref 4.5–9.0)

## 2020-12-21 LAB — DM TEMPLATE

## 2020-12-23 ENCOUNTER — Telehealth: Payer: Self-pay

## 2020-12-23 NOTE — Telephone Encounter (Signed)
Physical form completed, informed Pt that it is at front desk for pick up. Copy sent for scanning.

## 2020-12-25 ENCOUNTER — Encounter: Payer: 59 | Admitting: Internal Medicine

## 2021-01-07 ENCOUNTER — Encounter: Payer: Self-pay | Admitting: Internal Medicine

## 2021-01-13 ENCOUNTER — Encounter (HOSPITAL_COMMUNITY): Payer: Self-pay | Admitting: Radiology

## 2021-05-02 ENCOUNTER — Telehealth: Payer: Self-pay | Admitting: Internal Medicine

## 2021-05-04 NOTE — Telephone Encounter (Signed)
Requesting: LORAZEPAM 0.5MG  Contract: 12/17/20 UDS: 12/18/20 Last Visit: 12/17/20 Next Visit: 12/21/21 Last Refill: 12/08/20  Please Advise

## 2021-05-05 NOTE — Telephone Encounter (Signed)
PDMP reviewed, Rx sent 

## 2021-07-21 ENCOUNTER — Other Ambulatory Visit: Payer: Self-pay | Admitting: Surgery

## 2021-07-22 ENCOUNTER — Other Ambulatory Visit: Payer: Self-pay | Admitting: Surgery

## 2021-07-22 DIAGNOSIS — E049 Nontoxic goiter, unspecified: Secondary | ICD-10-CM

## 2021-07-22 DIAGNOSIS — E042 Nontoxic multinodular goiter: Secondary | ICD-10-CM

## 2021-08-12 ENCOUNTER — Other Ambulatory Visit (HOSPITAL_COMMUNITY)
Admission: RE | Admit: 2021-08-12 | Discharge: 2021-08-12 | Disposition: A | Discharge status: Home / Self Care | Payer: 59 | Source: Ambulatory Visit | Attending: Surgery | Admitting: Surgery

## 2021-08-12 ENCOUNTER — Ambulatory Visit
Admission: RE | Admit: 2021-08-12 | Discharge: 2021-08-12 | Disposition: A | Discharge status: Home / Self Care | Payer: 59 | Source: Ambulatory Visit | Attending: Surgery | Admitting: Surgery

## 2021-08-12 DIAGNOSIS — E042 Nontoxic multinodular goiter: Secondary | ICD-10-CM

## 2021-08-12 DIAGNOSIS — E041 Nontoxic single thyroid nodule: Secondary | ICD-10-CM | POA: Diagnosis not present

## 2021-08-12 DIAGNOSIS — E049 Nontoxic goiter, unspecified: Secondary | ICD-10-CM

## 2021-08-13 LAB — CYTOLOGY - NON PAP

## 2021-08-13 NOTE — Progress Notes (Signed)
Good news!  Third time is the charm!  Thyroid biopsy is benign. ? ?Will plan follow up in my office in one year with thyroid ultrasound and TSH level prior to office visit. ? ?Claiborne Billings - please arrange. ? ?Armandina Gemma, MD ?[facility] ?A [facility] practice ?Office: [phone] ? ?

## 2021-10-05 ENCOUNTER — Encounter: Payer: Self-pay | Admitting: Internal Medicine

## 2021-10-05 NOTE — Telephone Encounter (Signed)
Requesting: lorazepam 0.'5mg'$   Contract: 12/17/20 UDS: 12/18/20 Last Visit: 12/17/20 Next Visit: 01/05/22 Last Refill: 05/05/21 #30 and 5RF  NEW PHARMACY  Please Advise

## 2021-10-06 MED ORDER — LORAZEPAM 0.5 MG PO TABS: 0.5000 mg | ORAL_TABLET | Freq: Every evening | ORAL | 1 refills | Status: DC | PRN

## 2021-10-06 NOTE — Telephone Encounter (Signed)
PDMP reviewed, prescription sent to his new pharmacy

## 2021-10-21 ENCOUNTER — Other Ambulatory Visit: Payer: Self-pay | Admitting: Internal Medicine

## 2021-10-22 MED ORDER — TADALAFIL 20 MG PO TABS: 10.0000 mg | ORAL_TABLET | ORAL | 3 refills | Status: DC | PRN

## 2021-10-29 ENCOUNTER — Other Ambulatory Visit: Payer: Self-pay | Admitting: Internal Medicine

## 2021-11-09 ENCOUNTER — Other Ambulatory Visit: Payer: Self-pay | Admitting: Internal Medicine

## 2021-11-09 ENCOUNTER — Encounter: Payer: Self-pay | Admitting: Internal Medicine

## 2021-11-09 MED ORDER — SILDENAFIL CITRATE 20 MG PO TABS: 60.0000 mg | ORAL_TABLET | Freq: Every evening | ORAL | 5 refills | Status: DC | PRN

## 2021-12-08 ENCOUNTER — Encounter: Payer: Self-pay | Admitting: Internal Medicine

## 2021-12-09 ENCOUNTER — Encounter: Payer: Self-pay | Admitting: Internal Medicine

## 2021-12-09 ENCOUNTER — Ambulatory Visit (INDEPENDENT_AMBULATORY_CARE_PROVIDER_SITE_OTHER): Payer: 59 | Admitting: Internal Medicine

## 2021-12-09 VITALS — BP 130/78 | HR 87 | Temp 97.6°F | Resp 16 | Ht 72.0 in | Wt 201.0 lb

## 2021-12-09 DIAGNOSIS — E785 Hyperlipidemia, unspecified: Secondary | ICD-10-CM

## 2021-12-09 DIAGNOSIS — G4700 Insomnia, unspecified: Secondary | ICD-10-CM | POA: Diagnosis not present

## 2021-12-09 DIAGNOSIS — Z79899 Other long term (current) drug therapy: Secondary | ICD-10-CM

## 2021-12-09 DIAGNOSIS — Z0000 Encounter for general adult medical examination without abnormal findings: Secondary | ICD-10-CM | POA: Diagnosis not present

## 2021-12-09 NOTE — Progress Notes (Signed)
Subjective:    Patient ID: Trevor Hurst, male    DOB: May 10, 1960, 61 y.o.   MRN: [id number]  DOS:  12/09/2021 Type of visit - description: cpx  Here for CPX. Feeling well. Has lost weight, doing great with diet.  Wt Readings from Last 3 Encounters:  12/09/21 201 lb (91.2 kg)  12/17/20 216 lb (98 kg)  09/26/20 216 lb (98 kg)    Review of Systems  Other than above, a 14 point review of systems is negative     Past Medical History:  Diagnosis Date   Anxiety    Complication of anesthesia    Hyperlipidemia    PONV (postoperative nausea and vomiting)     Past Surgical History:  Procedure Laterality Date   HERNIA REPAIR  12 yrs ago   R    INGUINAL HERNIA REPAIR  12/24/2011   Procedure: LAPAROSCOPIC BILATERAL INGUINAL HERNIA REPAIR;  Surgeon: Gayland Curry, MD,FACS;  Location: [facility];  Service: General;;  DR Redmond Pulling SPOKE WITH PT WIFE ABOUT REPAIRING THE UMBILICAL AND LEFT INGUINAL HERNIA BOTH WITH MESH .  ALL  RISKS WERE DISCUSSED WITH  HER OVER THE PHONE.  WIFE INSTRUCTED HIM TO PROCEED WITH REPAIRING THE LEFT INGUINAL AND UMBILICAL HERNIA    INSERTION OF MESH N/A 02/24/2016   Procedure: INSERTION OF MESH;  Surgeon: Greer Pickerel, MD;  Location: [facility];  Service: General;  Laterality: N/A;   UMBILICAL HERNIA REPAIR  12/24/2011   Procedure: HERNIA REPAIR UMBILICAL ADULT;  Surgeon: Gayland Curry, MD,FACS;  Location: [facility];  Service: General;  Laterality: N/A;   VENTRAL HERNIA REPAIR N/A 02/24/2016   Procedure: LAPAROSCOPIC ASSISTED REPAIR OF SUPRAUMBILICAL  HERNIA WITH MESH;  Surgeon: Greer Pickerel, MD;  Location: [facility];  Service: General;  Laterality: N/A;   warts  12/23/11   dermatologist froze them on back calfs bilaterally   Social History   Socioeconomic History   Marital status: Married    Spouse name: Debbie   Number of children: 2   Years of education: Not on file   Highest education level: Not on file  Occupational History   Occupation: IT    Employer: [employer]   Tobacco Use   Smoking status: Former    Packs/day: 1.00    Years: 10.00    Total pack years: 10.00    Types: Cigarettes    Quit date: 04/30/2011    Years since quitting: 10.6   Smokeless tobacco: Never   Tobacco comments:    smoked 1979-2012 , ~ 1 ppd. Smoked total 29 years.  Substance and Sexual Activity   Alcohol use: No    Alcohol/week: 1.0 standard drink of alcohol    Types: 1 Standard drinks or equivalent per week   Drug use: No   Sexual activity: Not on file  Other Topics Concern   Not on file  Social History Narrative   Lives w/ wife;   2 children graduated   Social Determinants of Health   Financial Resource Strain: Not on file  Food Insecurity: Not on file  Transportation Needs: Not on file  Physical Activity: Not on file  Stress: Not on file  Social Connections: Not on file  Intimate Partner Violence: Not on file    Current Outpatient Medications  Medication Instructions   b complex vitamins capsule 1 capsule, Oral, Daily with lunch   Cholecalciferol (VITAMIN D-3) 1000 units CAPS 1 capsule, Oral, Daily   clobetasol ointment (TEMOVATE) 7.26 % 1 application ,  Topical, Every other day   cyclobenzaprine (FLEXERIL) 10 mg, Oral, At bedtime PRN   fexofenadine (ALLEGRA) 180 mg, Oral, Daily   ibuprofen (ADVIL) 400 mg, Oral, Every 6 hours PRN   LORazepam (ATIVAN) 0.5 mg, Oral, At bedtime PRN   nicotine polacrilex (NICORETTE) 4 mg, Oral, 4 times daily PRN   sildenafil (REVATIO) 60-80 mg, Oral, At bedtime PRN       Objective:   Physical Exam BP 130/78   Pulse 87   Temp 97.6 F (36.4 C) (Oral)   Resp 16   Ht 6' (1.829 m)   Wt 201 lb (91.2 kg)   SpO2 97%   BMI 27.26 kg/m  General: Well developed, NAD, BMI noted Neck: No  thyromegaly  HEENT:  Normocephalic . Face symmetric, atraumatic Lungs:  CTA B Normal respiratory effort, no intercostal retractions, no accessory muscle use. Heart: RRR,  no murmur.  Abdomen:  Not distended, soft, non-tender. No  rebound or rigidity.   Lower extremities: no pretibial edema bilaterally  Skin: Exposed areas without rash. Not pale. Not jaundice DRE: Normal sphincter tone, normal prostate, brown stool Neurologic:  alert & oriented X3.  Speech normal, gait appropriate for age and unassisted Strength symmetric and appropriate for age.  Psych: Cognition and judgment appear intact.  Cooperative with normal attention span and concentration.  Behavior appropriate. No anxious or depressed appearing.     Assessment     ASSESSMENT Anxiety, insomnia -- diazepam qhs, also uses nicotine gum at night Nicotine use gum but no tobacco per se Hyperlipidemia Psoriasis (per derm) -- clobetasol per derm Pre-ca skin lesion R knee  Multinodular goiter, follow-up Endo   PLAN: Here for CPX Anxiety, insomnia: On diazepam, well-controlled.  Check UDS Nicotine: Still using the gum, letter prepared for his job. Hyperlipidemia: Diet controlled, last CV RF at 10-year was 8.6.  Consider starting statins, patient aware. Thyroid nodules: Saw general surgery 07/15/2021, had a thyroid biopsy >>>  negative, was rec to follow-up in 1 year with ultrasound and TSH RTC 1 year

## 2021-12-09 NOTE — Patient Instructions (Addendum)
Recommend to proceed with the following vaccines at your pharmacy:  Covid booster (bivalent) Flu shot this fall       [place], [place] back for   blood work tomorrow, fasting  Come back for a physical exam in 1 year

## 2021-12-09 NOTE — Assessment & Plan Note (Signed)
Here for CPX Anxiety, insomnia: On diazepam, well-controlled.  Check UDS Nicotine: Still using the gum, letter prepared for his job. Hyperlipidemia: Diet controlled, last CV RF at 10-year was 8.6.  Consider starting statins, patient aware. Thyroid nodules: Saw general surgery 07/15/2021, had a thyroid biopsy >>>  negative, was rec to follow-up in 1 year with ultrasound and TSH RTC 1 year

## 2021-12-09 NOTE — Assessment & Plan Note (Signed)
-  Td 2016 - s/p shingrix - covid vax: benefits of booster d/w pt - flu shot  q fall -CCS cscope 11-2014, wnl , 10 years per report -Prostate cancer screening: DRE normal, no symptoms, check PSA -Former smoker, + FH lung cancer, lung cancer screening benefits discussed again today. -Diet-exercise: Doing great, has lost 15 pounds, his wife is doing weight watchers and he is following her path.praised . -Labs: CMP, FLP, CBC, PSA, UDS -POA: on file

## 2021-12-10 ENCOUNTER — Other Ambulatory Visit (INDEPENDENT_AMBULATORY_CARE_PROVIDER_SITE_OTHER): Payer: 59

## 2021-12-10 DIAGNOSIS — Z0000 Encounter for general adult medical examination without abnormal findings: Secondary | ICD-10-CM | POA: Diagnosis not present

## 2021-12-10 DIAGNOSIS — G4700 Insomnia, unspecified: Secondary | ICD-10-CM

## 2021-12-10 DIAGNOSIS — E785 Hyperlipidemia, unspecified: Secondary | ICD-10-CM | POA: Diagnosis not present

## 2021-12-10 DIAGNOSIS — Z79899 Other long term (current) drug therapy: Secondary | ICD-10-CM | POA: Diagnosis not present

## 2021-12-10 LAB — LIPID PANEL
Cholesterol: 198 mg/dL (ref 0–200)
HDL: 42.6 mg/dL (ref >39.00)
LDL Cholesterol: 139 mg/dL — ABNORMAL HIGH (ref 0–99)
NonHDL: 154.98
Total CHOL/HDL Ratio: 5
Triglycerides: 81 mg/dL (ref 0.0–149.0)
VLDL: 16.2 mg/dL (ref 0.0–40.0)

## 2021-12-10 LAB — COMPREHENSIVE METABOLIC PANEL
ALT: 18 U/L (ref 0–53)
AST: 21 U/L (ref 0–37)
Albumin: 4.5 g/dL (ref 3.5–5.2)
Alkaline Phosphatase: 74 U/L (ref 39–117)
BUN: 15 mg/dL (ref 6–23)
CO2: 31 mEq/L (ref 19–32)
Calcium: 9.1 mg/dL (ref 8.4–10.5)
Chloride: 100 mEq/L (ref 96–112)
Creatinine, Ser: 0.78 mg/dL (ref 0.40–1.50)
GFR: 97.31 mL/min (ref >60.00)
Glucose, Bld: 86 mg/dL (ref 70–99)
Potassium: 4 mEq/L (ref 3.5–5.1)
Sodium: 138 mEq/L (ref 135–145)
Total Bilirubin: 0.5 mg/dL (ref 0.2–1.2)
Total Protein: 6.8 g/dL (ref 6.0–8.3)

## 2021-12-10 LAB — CBC WITH DIFFERENTIAL/PLATELET
Basophils Absolute: 0 10*3/uL (ref 0.0–0.1)
Basophils Relative: 0.5 % (ref 0.0–3.0)
Eosinophils Absolute: 0.1 10*3/uL (ref 0.0–0.7)
Eosinophils Relative: 2.6 % (ref 0.0–5.0)
HCT: 46.3 % (ref 39.0–52.0)
Hemoglobin: 15.4 g/dL (ref 13.0–17.0)
Lymphocytes Relative: 24.6 % (ref 12.0–46.0)
Lymphs Abs: 1.3 10*3/uL (ref 0.7–4.0)
MCHC: 33.3 g/dL (ref 30.0–36.0)
MCV: 87.7 fl (ref 78.0–100.0)
Monocytes Absolute: 0.6 10*3/uL (ref 0.1–1.0)
Monocytes Relative: 10.6 % (ref 3.0–12.0)
Neutro Abs: 3.2 10*3/uL (ref 1.4–7.7)
Neutrophils Relative %: 61.7 % (ref 43.0–77.0)
Platelets: 160 10*3/uL (ref 150.0–400.0)
RBC: 5.28 Mil/uL (ref 4.22–5.81)
RDW: 13.6 % (ref 11.5–15.5)
WBC: 5.2 10*3/uL (ref 4.0–10.5)

## 2021-12-10 LAB — PSA: PSA: 2.77 ng/mL (ref 0.10–4.00)

## 2021-12-11 ENCOUNTER — Encounter: Payer: Self-pay | Admitting: Internal Medicine

## 2021-12-12 LAB — DRUG MONITORING PANEL 375977 , URINE

## 2021-12-12 LAB — DM TEMPLATE

## 2021-12-14 ENCOUNTER — Telehealth: Payer: Self-pay

## 2021-12-14 NOTE — Telephone Encounter (Signed)
Physical form completed. LMOM informing Pt that form is ready for pick up at front desk. Copy sent for scanning.

## 2021-12-21 ENCOUNTER — Encounter: Payer: 59 | Admitting: Internal Medicine

## 2021-12-26 ENCOUNTER — Other Ambulatory Visit: Payer: Self-pay | Admitting: Internal Medicine

## 2022-01-05 ENCOUNTER — Encounter: Payer: 59 | Admitting: Internal Medicine

## 2022-01-23 ENCOUNTER — Other Ambulatory Visit: Payer: Self-pay | Admitting: Family

## 2022-01-26 ENCOUNTER — Telehealth: Payer: Self-pay | Admitting: Internal Medicine

## 2022-01-27 NOTE — Telephone Encounter (Signed)
PDMP okay, Rx sent 

## 2022-01-27 NOTE — Telephone Encounter (Signed)
Requesting: lorazepam 0.'5mg'$   Contract:12/17/20 UDS:12/10/21 Last Visit: 12/09/21 Next Visit:12/13/22 Last Refill: 12/27/21 #30 and 0RF   Please Advise

## 2022-07-23 ENCOUNTER — Telehealth: Payer: Self-pay | Admitting: Internal Medicine

## 2022-07-26 NOTE — Telephone Encounter (Signed)
Requesting: lorazepam 0.5mg   Contract: 12/17/20 UDS: 12/10/21 Last Visit: 12/09/21 Next Visit: 12/13/22 Last Refill: 01/27/22 #30 and 5RF  Please Advise

## 2022-07-26 NOTE — Telephone Encounter (Signed)
PDMP okay, Rx sent 

## 2022-08-06 ENCOUNTER — Other Ambulatory Visit: Payer: Self-pay | Admitting: Surgery

## 2022-08-06 DIAGNOSIS — E049 Nontoxic goiter, unspecified: Secondary | ICD-10-CM

## 2022-08-06 DIAGNOSIS — E042 Nontoxic multinodular goiter: Secondary | ICD-10-CM

## 2022-08-25 IMAGING — US US FNA BIOPSY THYROID 1ST LESION
2 series · 13 of 25 positions shown · non-contrast
Comparison: Ultrasound thyroid December 28, 2019

MEDICATIONS:
1% lidocaine 5 mL

COMPLICATIONS:
None immediate.

INDICATION: Indeterminate right mid thyroid nodule

EXAM:
ULTRASOUND GUIDED FINE NEEDLE ASPIRATION OF INDETERMINATE right mid
THYROID NODULE
TECHNIQUE: Informed written consent was obtained from the patient after a
discussion of the risks, benefits and alternatives to treatment.
Questions regarding the procedure were encouraged and answered. A
timeout was performed prior to the initiation of the procedure.

[Series 1: us fna biopsy thyroid 1st lesion · 20 acquisitions, 6 frames shown (1 of 2)]
[im 1/20]
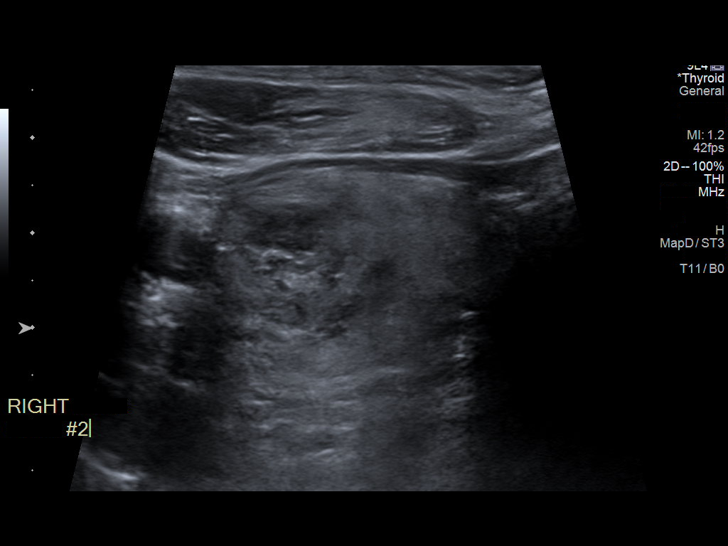
[im 4/20]
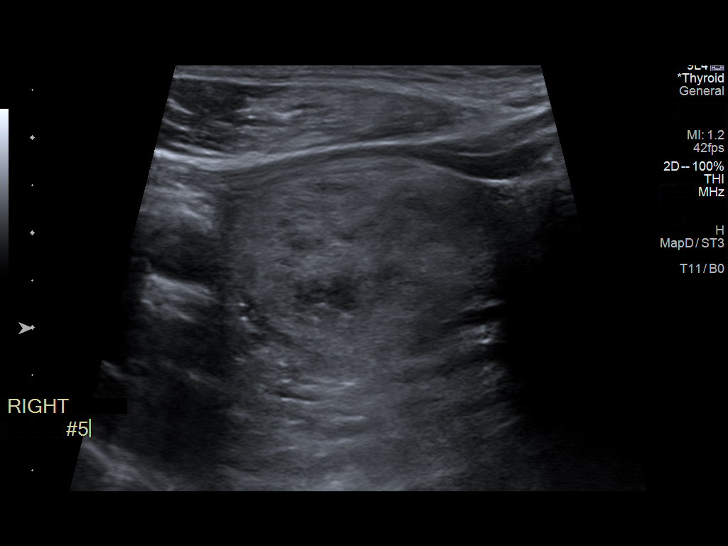
[im 7/20]
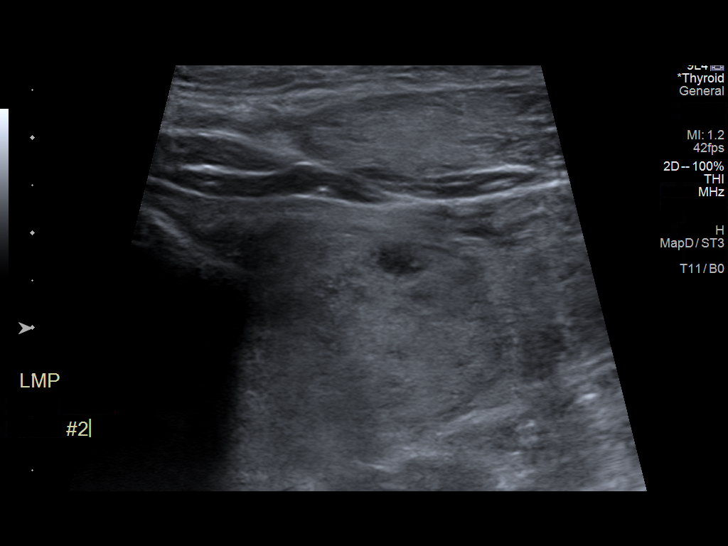
[im 11/20]
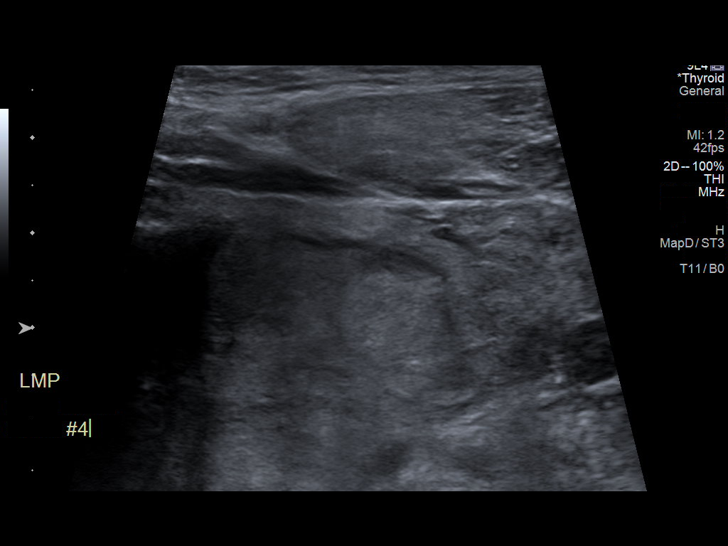
[im 14/20]
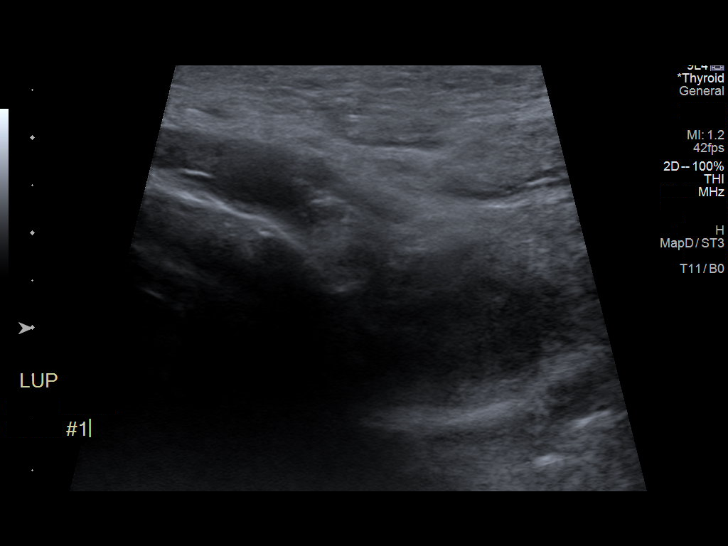
[im 18/20]
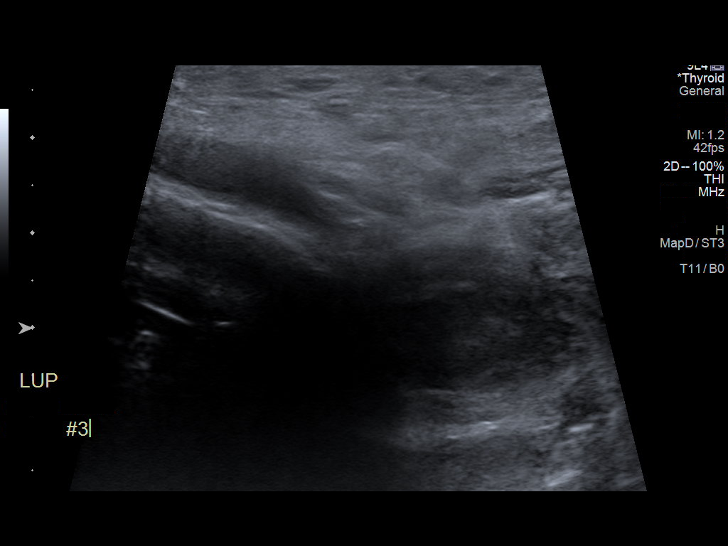

[Series 1: us fna biopsy thyroid 1st lesion · 0.07mm/px · 7 of 21 slices shown (2 of 2)]
[im 1/21]
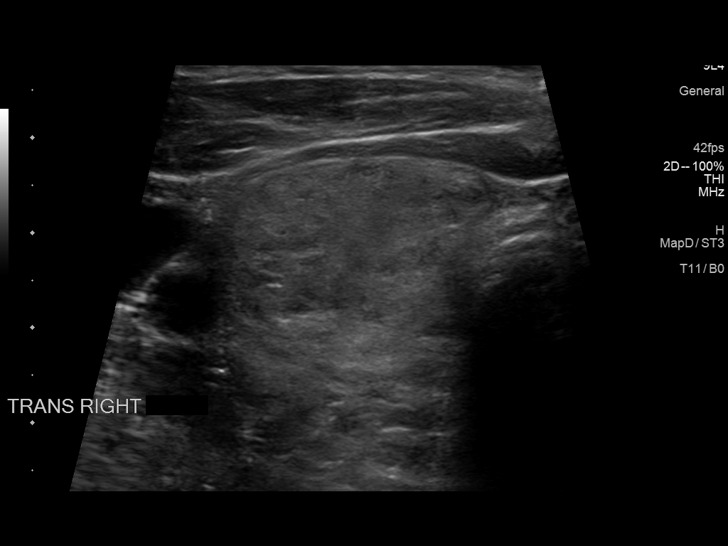
[im 4/21]
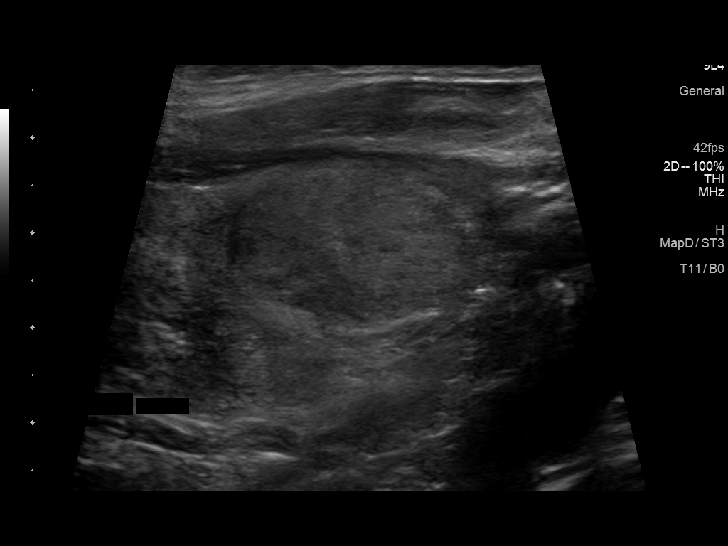
[im 7/21]
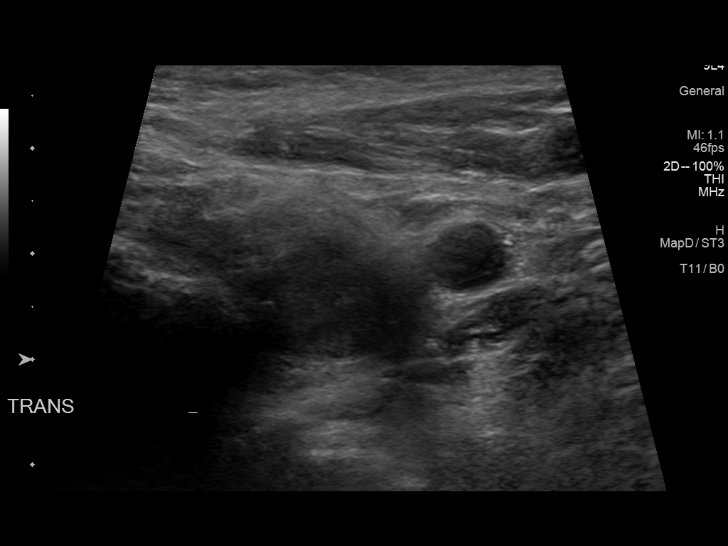
[im 11/21]
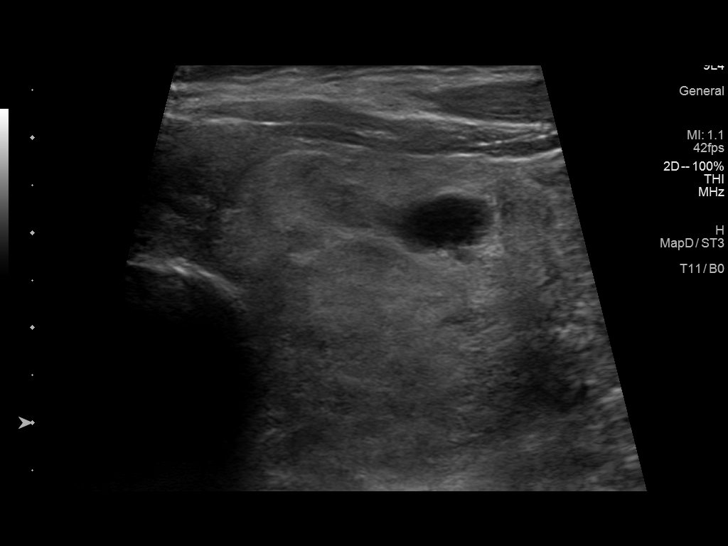
[im 14/21]
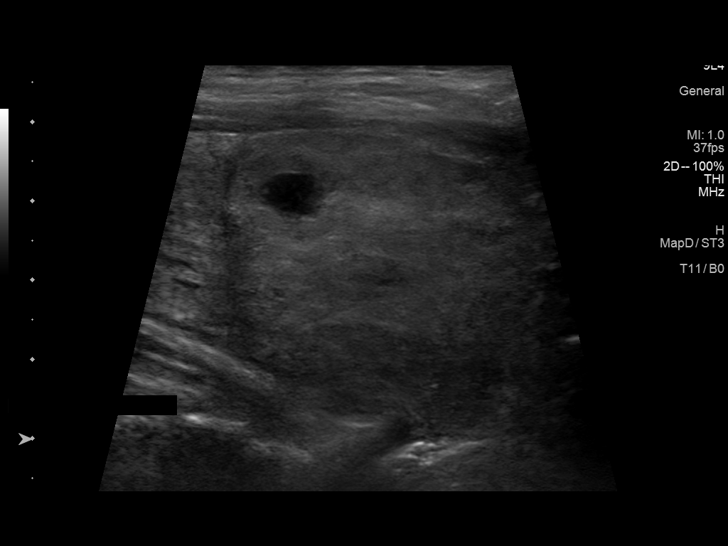
[im 17/21]
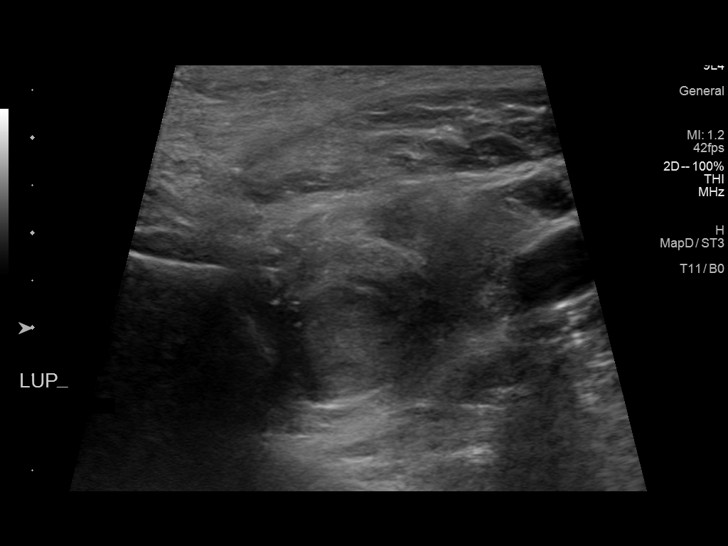
[im 21/21]
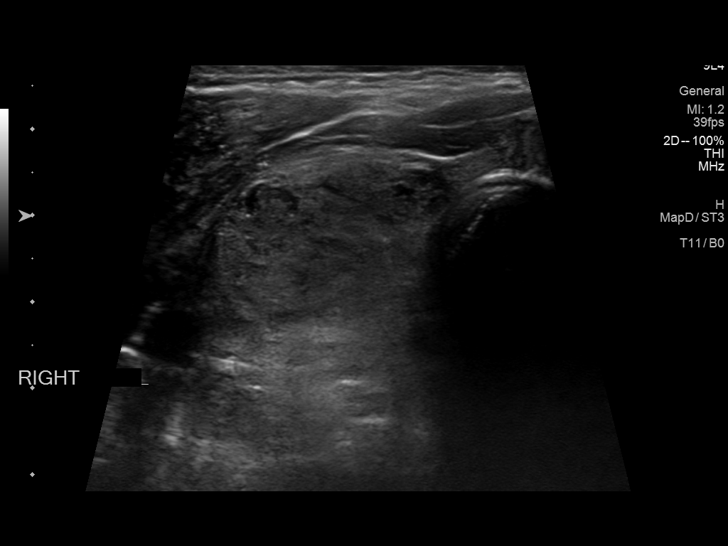

[13 of 25 positions shown; findings below may reference images not displayed]

Pre-procedural ultrasound scanning demonstrated unchanged size and
appearance of the indeterminate nodule within the right mid thyroid
nodule.

The procedure was planned. The neck was prepped in the usual sterile
fashion, and a sterile drape was applied covering the operative
field. A timeout was performed prior to the initiation of the
procedure. Local anesthesia was provided with 1% lidocaine.

Under direct ultrasound guidance, 5 FNA biopsies were performed of
the right mid thyroid nodule with a 25 gauge needle. Multiple
ultrasound images were saved for procedural documentation purposes.
The samples were prepared and submitted to pathology. Two of the
samples were prepared for Afirma testing.

Limited post procedural scanning was negative for hematoma or
additional complication. Dressings were placed. The patient
tolerated the above procedures procedure well without immediate
postprocedural complication.
FINDINGS: Nodule reference number based on prior diagnostic ultrasound: 2

Maximum size: 2.9 cm

Location: Right; Mid

ACR TI-RADS risk category: TR3 (3 points)

Reason for biopsy: meets ACR TI-RADS criteria

Ultrasound imaging confirms appropriate placement of the needles
within the thyroid nodule.
IMPRESSION: Technically successful ultrasound guided fine needle aspiration of
right mid thyroid nodule. Read by: Julee Hira, NP

## 2022-09-05 ENCOUNTER — Encounter: Payer: Self-pay | Admitting: Internal Medicine

## 2022-09-06 MED ORDER — CYCLOBENZAPRINE HCL 10 MG PO TABS: 10.0000 mg | ORAL_TABLET | Freq: Every evening | ORAL | 1 refills | Status: AC | PRN

## 2022-09-15 ENCOUNTER — Ambulatory Visit
Admission: RE | Admit: 2022-09-15 | Discharge: 2022-09-15 | Disposition: A | Discharge status: Home / Self Care | Payer: 59 | Source: Ambulatory Visit | Attending: Surgery | Admitting: Surgery

## 2022-09-15 DIAGNOSIS — E049 Nontoxic goiter, unspecified: Secondary | ICD-10-CM

## 2022-09-15 DIAGNOSIS — E042 Nontoxic multinodular goiter: Secondary | ICD-10-CM

## 2022-11-30 ENCOUNTER — Telehealth: Payer: Self-pay

## 2022-11-30 ENCOUNTER — Other Ambulatory Visit: Payer: Self-pay | Admitting: Internal Medicine

## 2022-11-30 NOTE — Telephone Encounter (Signed)
PA initiated via Covermymeds; KEY: [id number]. Awaiting determination.

## 2022-12-03 NOTE — Telephone Encounter (Signed)
Received fax from [employer] for PA. Form completed and faxed back to [phone]. Eoc id: [id number]. Awaiting determination.

## 2022-12-09 NOTE — Telephone Encounter (Signed)
Received fax 12/07/2022 from [employer], Prior Auth was denied for Sildenafil citrate ID # [id number]  Possible covered alternatives: Sildenafil 25 mg, 50 mg, abd 100 mg are indicated for the treatment of erectile dysfunction. Will let provider review 12/09/2022

## 2022-12-10 ENCOUNTER — Telehealth: Payer: Self-pay

## 2022-12-10 MED ORDER — SILDENAFIL CITRATE 50 MG PO TABS: 50.0000 mg | ORAL_TABLET | Freq: Every day | ORAL | 2 refills | Status: DC | PRN

## 2022-12-10 NOTE — Addendum Note (Signed)
Addended by: Willow Ora E on: 12/10/2022 11:14 AM   Modules accepted: Orders

## 2022-12-10 NOTE — Telephone Encounter (Signed)
Sildenafil Citrate 20 mg was denied but possible covered alternatives: 25 mg, 50 mg, and 100 mg are indicated for the treatment of erectile dysfunction.

## 2022-12-10 NOTE — Telephone Encounter (Signed)
Called left a detailed message for patient and informed to contact office with any questions or concerns.

## 2022-12-10 NOTE — Telephone Encounter (Signed)
Prescription sent.  Please let him know that he only needs 1 or 2 tablets if needed

## 2022-12-13 ENCOUNTER — Encounter: Payer: Self-pay | Admitting: Internal Medicine

## 2022-12-13 ENCOUNTER — Ambulatory Visit (INDEPENDENT_AMBULATORY_CARE_PROVIDER_SITE_OTHER): Payer: Managed Care, Other (non HMO) | Admitting: Internal Medicine

## 2022-12-13 VITALS — BP 132/82 | HR 79 | Temp 98.0°F | Resp 16 | Ht 72.0 in | Wt 198.2 lb

## 2022-12-13 DIAGNOSIS — E785 Hyperlipidemia, unspecified: Secondary | ICD-10-CM

## 2022-12-13 DIAGNOSIS — Z79899 Other long term (current) drug therapy: Secondary | ICD-10-CM

## 2022-12-13 DIAGNOSIS — G4700 Insomnia, unspecified: Secondary | ICD-10-CM | POA: Diagnosis not present

## 2022-12-13 DIAGNOSIS — Z0000 Encounter for general adult medical examination without abnormal findings: Secondary | ICD-10-CM | POA: Diagnosis not present

## 2022-12-13 DIAGNOSIS — Z125 Encounter for screening for malignant neoplasm of prostate: Secondary | ICD-10-CM

## 2022-12-13 LAB — CBC WITH DIFFERENTIAL/PLATELET
Basophils Absolute: 0 10*3/uL (ref 0.0–0.1)
Basophils Relative: 0.4 % (ref 0.0–3.0)
Eosinophils Absolute: 0.2 10*3/uL (ref 0.0–0.7)
Eosinophils Relative: 3.8 % (ref 0.0–5.0)
HCT: 46.8 % (ref 39.0–52.0)
Hemoglobin: 15.5 g/dL (ref 13.0–17.0)
Lymphocytes Relative: 22 % (ref 12.0–46.0)
Lymphs Abs: 1.1 10*3/uL (ref 0.7–4.0)
MCHC: 33.2 g/dL (ref 30.0–36.0)
MCV: 88.6 fl (ref 78.0–100.0)
Monocytes Absolute: 0.4 10*3/uL (ref 0.1–1.0)
Monocytes Relative: 8.6 % (ref 3.0–12.0)
Neutro Abs: 3.3 10*3/uL (ref 1.4–7.7)
Neutrophils Relative %: 65.2 % (ref 43.0–77.0)
Platelets: 173 10*3/uL (ref 150.0–400.0)
RBC: 5.28 Mil/uL (ref 4.22–5.81)
RDW: 13.5 % (ref 11.5–15.5)
WBC: 5.1 10*3/uL (ref 4.0–10.5)

## 2022-12-13 LAB — COMPREHENSIVE METABOLIC PANEL
ALT: 17 U/L (ref 0–53)
AST: 19 U/L (ref 0–37)
Albumin: 4.4 g/dL (ref 3.5–5.2)
Alkaline Phosphatase: 58 U/L (ref 39–117)
BUN: 14 mg/dL (ref 6–23)
CO2: 32 mEq/L (ref 19–32)
Calcium: 9.3 mg/dL (ref 8.4–10.5)
Chloride: 100 mEq/L (ref 96–112)
Creatinine, Ser: 0.81 mg/dL (ref 0.40–1.50)
GFR: 95.53 mL/min (ref >60.00)
Glucose, Bld: 91 mg/dL (ref 70–99)
Potassium: 4.5 mEq/L (ref 3.5–5.1)
Sodium: 137 mEq/L (ref 135–145)
Total Bilirubin: 0.5 mg/dL (ref 0.2–1.2)
Total Protein: 6.5 g/dL (ref 6.0–8.3)

## 2022-12-13 LAB — LIPID PANEL
Cholesterol: 189 mg/dL (ref 0–200)
HDL: 45.2 mg/dL (ref >39.00)
LDL Cholesterol: 120 mg/dL — ABNORMAL HIGH (ref 0–99)
NonHDL: 143.76
Total CHOL/HDL Ratio: 4
Triglycerides: 118 mg/dL (ref 0.0–149.0)
VLDL: 23.6 mg/dL (ref 0.0–40.0)

## 2022-12-13 LAB — PSA: PSA: 3.52 ng/mL (ref 0.10–4.00)

## 2022-12-13 LAB — TSH: TSH: 0.37 u[IU]/mL (ref 0.35–5.50)

## 2022-12-13 NOTE — Patient Instructions (Addendum)
Vaccines I recommend: Covid booster Flu shot this fall     GO TO THE LAB : Get the blood work     GO TO THE FRONT DESK, PLEASE SCHEDULE YOUR APPOINTMENTS Come back for   physical exam in 1 year   Please read:  This is a summary of the advice provided today.  Please read if you have questions about today's visit.  You can always call us back for more information.  If you have MyChart, please use it. Let the APP send you alerts and  reminders.   If you are not planning to use MyChart, please deactivate it.  If you have it and don't use it, you won't see my comments about your results.  If I order blood work, x-rays or referrals and you do not see your results or don't hear about the referrals within a week:  please call my office.

## 2022-12-13 NOTE — Progress Notes (Signed)
Subjective:    Patient ID: Trevor Hurst, male    DOB: 04/20/1961, 62 y.o.   MRN: [id number]  DOS:  12/13/2022 Type of visit - description: cpx  CPX, doing great.  Wt Readings from Last 3 Encounters:  12/13/22 198 lb 4 oz (89.9 kg)  12/09/21 201 lb (91.2 kg)  12/17/20 216 lb (98 kg)    Review of Systems   A 14 point review of systems is negative    Past Medical History:  Diagnosis Date   Anxiety    Complication of anesthesia    Hyperlipidemia    PONV (postoperative nausea and vomiting)     Past Surgical History:  Procedure Laterality Date   HERNIA REPAIR  12 yrs ago   R    INGUINAL HERNIA REPAIR  12/24/2011   Procedure: LAPAROSCOPIC BILATERAL INGUINAL HERNIA REPAIR;  Surgeon: Atilano Ina, MD,FACS;  Location: [facility];  Service: General;;  DR Andrey Campanile SPOKE WITH PT WIFE ABOUT REPAIRING THE UMBILICAL AND LEFT INGUINAL HERNIA BOTH WITH MESH .  ALL  RISKS WERE DISCUSSED WITH  HER OVER THE PHONE.  WIFE INSTRUCTED HIM TO PROCEED WITH REPAIRING THE LEFT INGUINAL AND UMBILICAL HERNIA    INSERTION OF MESH N/A 02/24/2016   Procedure: INSERTION OF MESH;  Surgeon: Gaynelle Adu, MD;  Location: [facility];  Service: General;  Laterality: N/A;   UMBILICAL HERNIA REPAIR  12/24/2011   Procedure: HERNIA REPAIR UMBILICAL ADULT;  Surgeon: Atilano Ina, MD,FACS;  Location: [facility];  Service: General;  Laterality: N/A;   VENTRAL HERNIA REPAIR N/A 02/24/2016   Procedure: LAPAROSCOPIC ASSISTED REPAIR OF SUPRAUMBILICAL  HERNIA WITH MESH;  Surgeon: Gaynelle Adu, MD;  Location: [facility];  Service: General;  Laterality: N/A;   warts  12/23/11   dermatologist froze them on back calfs bilaterally   Social History   Social History Narrative   Lives w/ wife;   2 children graduated     Current Outpatient Medications  Medication Instructions   b complex vitamins capsule 1 capsule, Oral, Daily with lunch   Cholecalciferol (VITAMIN D-3) 1000 units CAPS 1 capsule, Oral, Daily   clobetasol ointment (TEMOVATE)  0.05 % 1 application , Topical, Every other day   cyclobenzaprine (FLEXERIL) 10 mg, Oral, At bedtime PRN   fexofenadine (ALLEGRA) 180 mg, Oral, Daily   ibuprofen (ADVIL) 400 mg, Every 6 hours PRN   LORazepam (ATIVAN) 0.5 MG tablet TAKE 1 TABLET BY MOUTH EVERY DAY AT BEDTIME AS NEEDED FOR SLEEP   nicotine polacrilex (NICORETTE) 4 mg, Oral, 4 times daily PRN   sildenafil (VIAGRA) 50-100 mg, Oral, Daily PRN       Objective:   Physical Exam BP 132/82   Pulse 79   Temp 98 F (36.7 C) (Oral)   Resp 16   Ht 6' (1.829 m)   Wt 198 lb 4 oz (89.9 kg)   SpO2 97%   BMI 26.89 kg/m  General: Well developed, NAD, BMI noted Neck: No  thyromegaly  HEENT:  Normocephalic . Face symmetric, atraumatic Lungs:  CTA B Normal respiratory effort, no intercostal retractions, no accessory muscle use. Heart: RRR,  no murmur.  Abdomen:  Not distended, soft, non-tender. No rebound or rigidity.   Lower extremities: no pretibial edema bilaterally  Skin: Exposed areas without rash. Not pale. Not jaundice Neurologic:  alert & oriented X3.  Speech normal, gait appropriate for age and unassisted Strength symmetric and appropriate for age.  Psych: Cognition and judgment appear intact.  Cooperative with  normal attention span and concentration.  Behavior appropriate. No anxious or depressed appearing.     Assessment     ASSESSMENT Anxiety, insomnia -- diazepam qhs, also uses nicotine gum at night Nicotine use gum but no tobacco per se Hyperlipidemia Psoriasis (per derm) -- clobetasol per derm Pre-ca skin lesion R knee Multinodular goiter, follow-up Endo, surgery LBP- flexeril prn  PLAN: Here for CPX -Td 2016 - s/p shingrix -Vaccines I recommend: COVID booster if not done recently.  Flu shot every fall. -CCS cscope 11-2014, wnl , 10 years per report -Prostate cancer screening:  no symptoms, check PSA -Former smoker, + FH lung cancer, lung cancer screening discussed again, will let me know if  interested -Diet-exercise: Doing well, goes to the gym 3 times a week, follows a healthy diet, weight watchers.. -Labs: CMP, FLP, CBC, TSH PSA, UDS insomnia: On diazepam nightly; UDS and contract today. Hyperlipidemia: Diet controlled, has had a very healthy lifestyle.  Labs Thyroid nodules: Per general surgery LBP: Takes Flexeril sporadically, call for RF when needed RTC 1 year

## 2022-12-13 NOTE — Telephone Encounter (Signed)
Noted  

## 2022-12-13 NOTE — Assessment & Plan Note (Signed)
Here for CPX -Td 2016 - s/p shingrix -Vaccines I recommend: COVID booster if not done recently.  Flu shot every fall. -CCS cscope 11-2014, wnl , 10 years per report -Prostate cancer screening:  no symptoms, check PSA -Former smoker, + FH lung cancer, lung cancer screening discussed again, will let me know if interested -Diet-exercise: Doing well, goes to the gym 3 times a week, follows a healthy diet, weight watchers.. -Labs: CMP, FLP, CBC, TSH PSA, UDS

## 2022-12-13 NOTE — Assessment & Plan Note (Signed)
Here for CPX insomnia: On diazepam nightly; UDS and contract today. Hyperlipidemia: Diet controlled, has had a very healthy lifestyle.  Labs Thyroid nodules: Per general surgery LBP: Takes Flexeril sporadically, call for RF when needed RTC 1 year

## 2022-12-17 ENCOUNTER — Telehealth: Payer: Self-pay

## 2022-12-17 NOTE — Telephone Encounter (Signed)
Spoke w/ Pt- informed that physical form and nicotine letter is ready for pick up at front desk at his convenience. Copy of form sent for scanning.

## 2022-12-20 ENCOUNTER — Encounter: Payer: Self-pay | Admitting: Internal Medicine

## 2022-12-20 DIAGNOSIS — E785 Hyperlipidemia, unspecified: Secondary | ICD-10-CM

## 2022-12-21 NOTE — Telephone Encounter (Signed)
Order placed

## 2022-12-21 NOTE — Addendum Note (Signed)
Addended by: Conrad [place] D on: 12/21/2022 10:00 AM   Modules accepted: Orders

## 2022-12-21 NOTE — Telephone Encounter (Signed)
Enter future orders to be done in 6 months: FLP, Dx high cholesterol

## 2023-01-22 ENCOUNTER — Telehealth: Payer: Self-pay | Admitting: Internal Medicine

## 2023-01-24 NOTE — Telephone Encounter (Signed)
Requesting: lorazepam 0.5mg   Contract: 12/13/22 UDS: 12/13/22 Last Visit: 12/13/22 Next Visit:  12/14/23 Last Refill: 07/26/22 #30 and 5RF   Please Advise

## 2023-01-24 NOTE — Telephone Encounter (Signed)
Pdmp ok, rx sent  ? ?

## 2023-03-29 IMAGING — US US THYROID
1 series · 12 of 25 positions shown · non-contrast
Comparison: May 2020,January 2020,December 2019

CLINICAL DATA: Prior ultrasound follow-up. Multinodular thyroid
gland status post multiple biopsies

EXAM:
THYROID ULTRASOUND
TECHNIQUE: Ultrasound examination of the thyroid gland and adjacent soft
tissues was performed.

[Series 1: us thyroid · 12 of 52 slices shown]
[im 3/52]
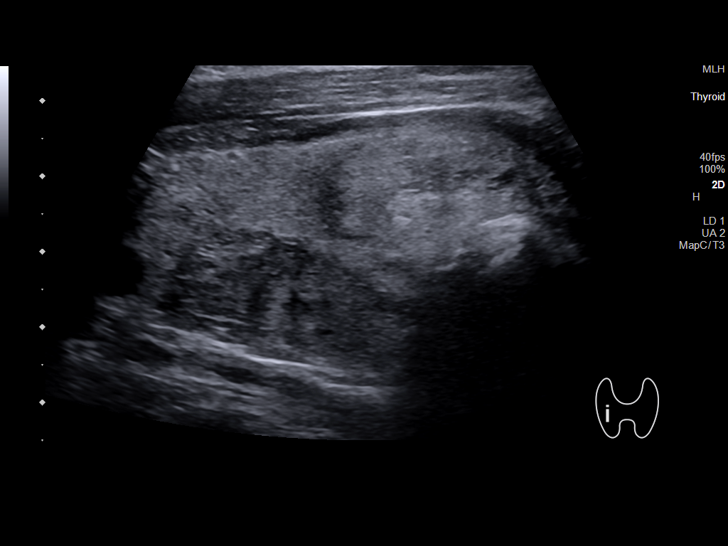
[im 7/52]
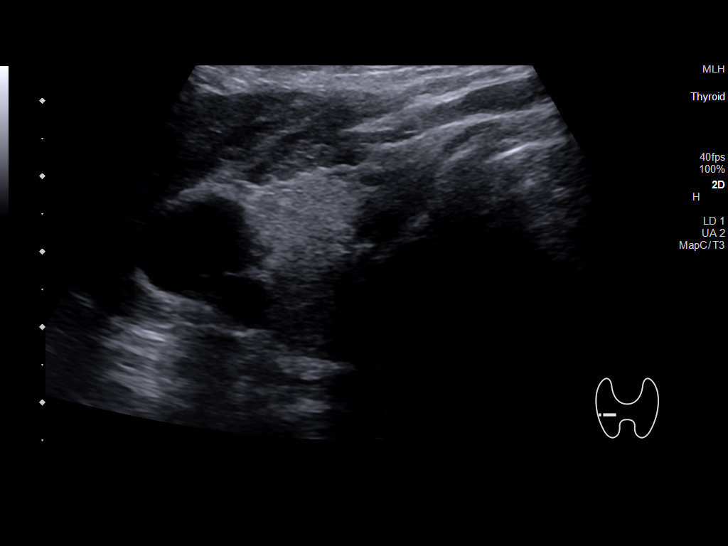
[im 11/52]
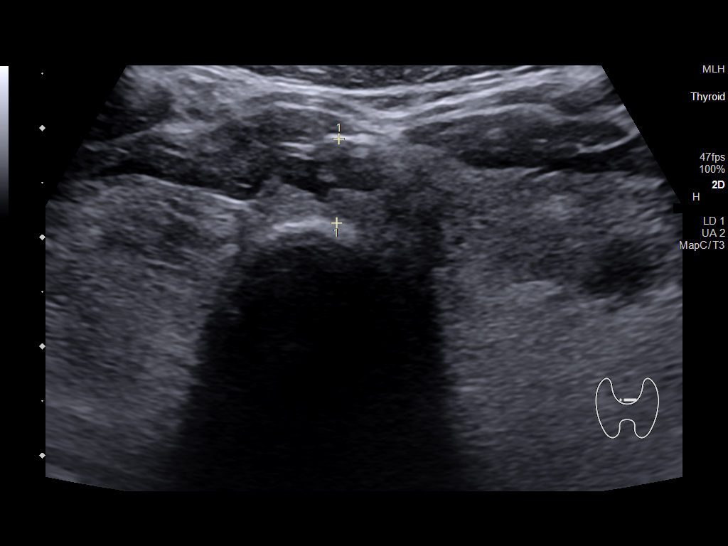
[im 15/52]
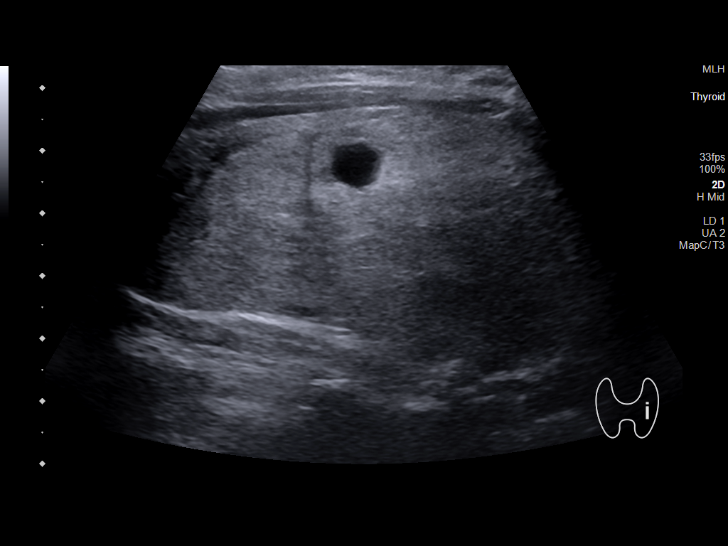
[im 20/52]
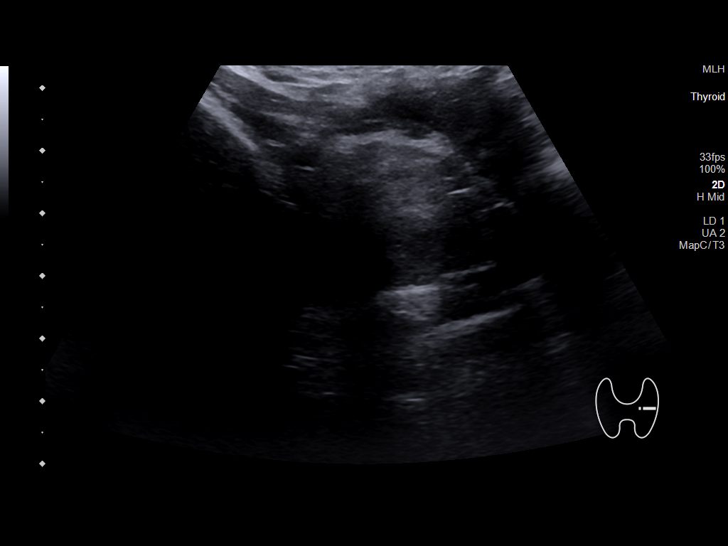
[im 24/52]
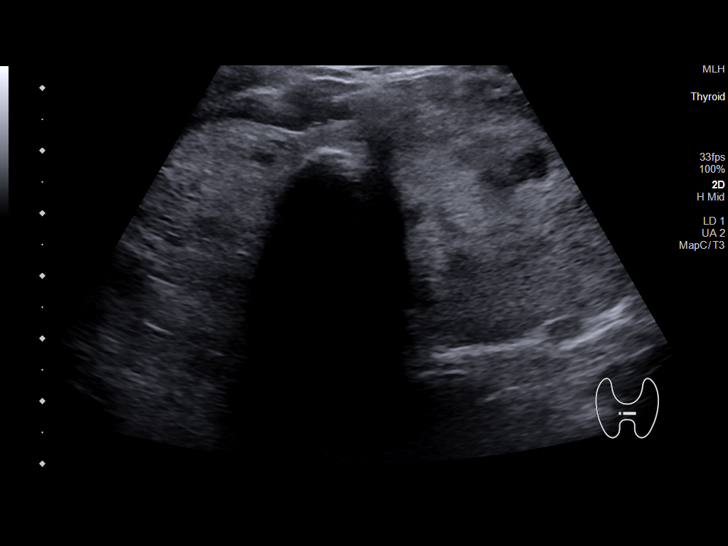
[im 28/52]
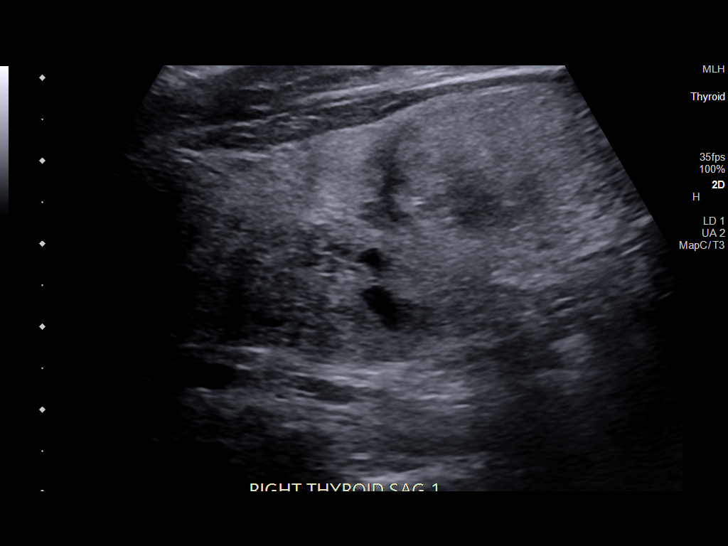
[im 32/52]
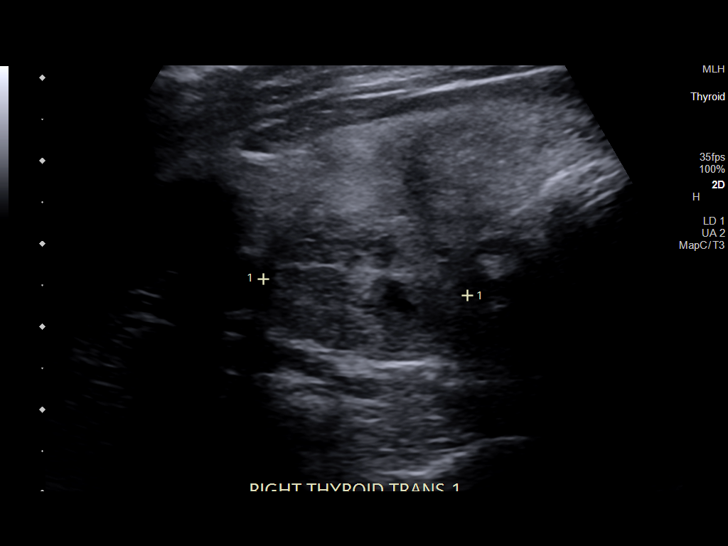
[im 37/52]
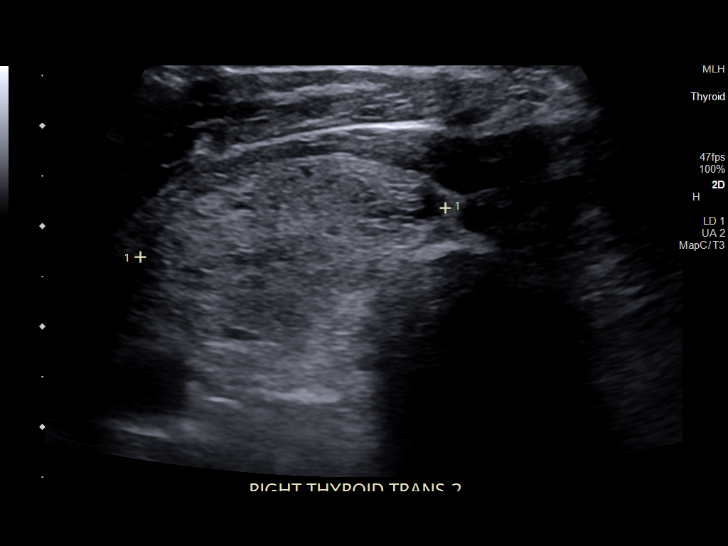
[im 41/52]
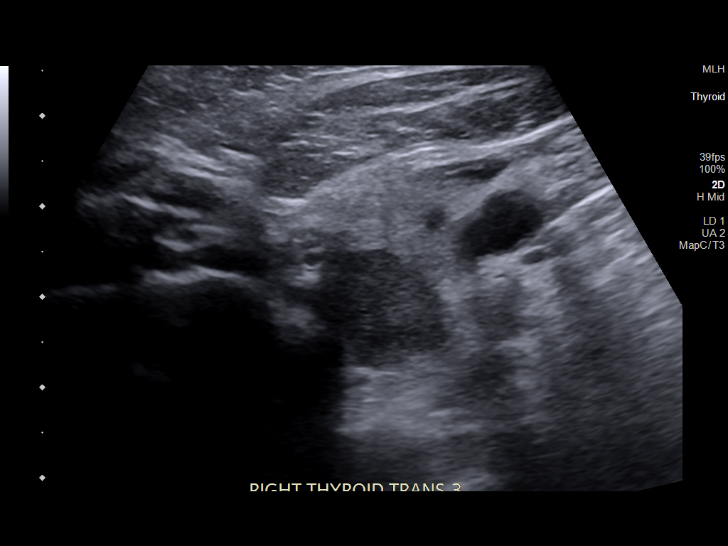
[im 45/52]
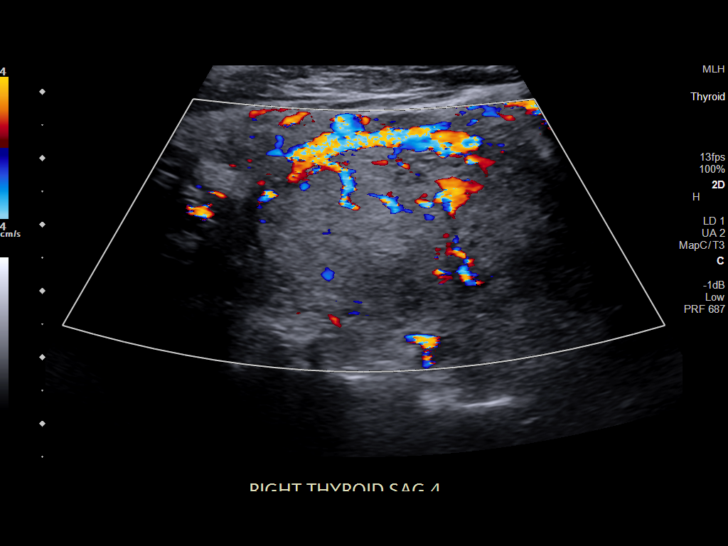
[im 49/52]
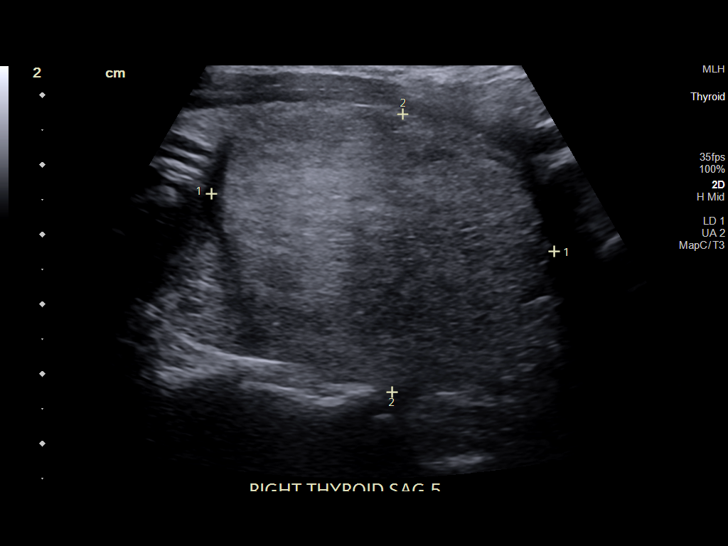

[12 of 25 positions shown; findings below may reference images not displayed]

FINDINGS: Parenchymal Echotexture: Mildly heterogenous

Isthmus: 0.8 cm

Right lobe: 6.0 x 2.9 x 3.0 cm

Left lobe: 8.3 x 4.3 x 4.9 cm

_________________________________________________________

Estimated total number of nodules >/= 1 cm: 5

Number of spongiform nodules >/=  2 cm not described below (TR1): 0

Number of mixed cystic and solid nodules >/= 1.5 cm not described
below (TR2): 0

_________________________________________________________

Nodule # 1:

Location: Right; Mid

Maximum size: 2.7 cm; Other 2 dimensions: 1.7 x 2.5 cm

Composition: solid/almost completely solid (2)

Echogenicity: hypoechoic (2)

Shape: not taller-than-wide (0)

Margins: ill-defined (0)

Echogenic foci: none (0)

ACR TI-RADS total points: 4.

ACR TI-RADS risk category: TR4 (4-6 points).

ACR TI-RADS recommendations:

**Given size (>/= 1.5 cm) and appearance, fine needle aspiration of
this moderately suspicious nodule should be considered based on
TI-RADS criteria.

_________________________________________________________

Nodule # 2:

Location: Right; Mid

Maximum size: 3.0 cm; Other 2 dimensions: 1.9 x 2.9 cm

This nodule in the mid right thyroid lobe was previously biopsied in
May 2020, with pathology report describing a benign follicular
nodule ([REDACTED] category 2). It remains similar in size.

_________________________________________________________

Nodule # 3:

Location: Left; Superior

Maximum size: 1.7 cm; Other 2 dimensions: 1.3 x 1.6 cm

This nodule in the superior left thyroid lobe was previously
biopsied in January 2020 in May 2020, with the most recent
biopsy report describing atypia of undetermined significance
([REDACTED] category 3). It has remained similar in size up to 1.7 cm.

_________________________________________________________

There is a finding labeled as nodule 4 which was previously felt to
represent a pseudo nodule due to the lack of well-defined borders.
Again, this nodule does not appear well-defined and area appears
better delineated on previous ultrasound in December 2019, and I agree
that this likely represents a pseudo nodule and not a true discrete
lesion.

_________________________________________________________

Nodule # 5:

Location: Left; Inferior

Maximum size: 5.0 cm; Other 2 dimensions: 4.0 x 4.2 cm

This solid nodule in the inferior left thyroid lobe was previously
biopsied x2 attempts in January 2020 in May 2020, both
yielding in adequate cellular material for definitive diagnosis
([REDACTED] category 1). It overall remains similar in size and
appearance.

_________________________________________________________
IMPRESSION: Multilocular thyroid gland with complex biopsy history of multiple
nodules as described above. These are summarized below here:

1. Nodule 1 TR 4 measuring 2.7 cm, meets criteria for biopsy.
2. Nodule 2, previously biopsied, [REDACTED] category 2. Remains
similar in size and appearance.
3. Nodule 3, previously biopsied, [REDACTED] category 3. Remains
similar in size and appearance.
4. Nodule 5, previously biopsied x2, [REDACTED] category 1. Remains
similar in size and appearance. Biopsy of this lesion would still be
recommended based on TI-RADS criteria alone, and addional repeat
biopsy can be considered.

The above is in keeping with the ACR TI-RADS recommendations - [HOSPITAL] 6241;[DATE].

## 2023-06-20 ENCOUNTER — Telehealth: Payer: Self-pay

## 2023-06-20 NOTE — Telephone Encounter (Signed)
 Copied from CRM [id number]. Topic: Clinical - Prescription Issue >> Jun 20, 2023  9:50 AM Larwance Sachs wrote: Reason for CRM: Joselyn Glassman from [facility] pharmacy called in regarding prescription for LORazepam (ATIVAN) 0.5 MG tablet, stated now they are required to have a little more additional info before filling medication. Would need to know the diagnosis or reason for patient needing medication as well as last date patient was seen by pcp. Please send over info so medication can be filled and call back number is  [phone]

## 2023-06-20 NOTE — Telephone Encounter (Signed)
 Spoke w/ Joselyn Glassman- information given.

## 2023-06-20 NOTE — Telephone Encounter (Signed)
 Last visit 12/13/2022  Reason for the prescription insomnia, anxiety.

## 2023-07-23 ENCOUNTER — Telehealth: Payer: Self-pay | Admitting: Internal Medicine

## 2023-07-25 NOTE — Telephone Encounter (Signed)
PDMP okay, prescription sent 

## 2023-07-25 NOTE — Telephone Encounter (Signed)
 Requesting: lorazepam 0.5mg   Contract: 12/13/22 UDS: 12/13/22 Last Visit: 12/13/22 Next Visit:  12/14/23 Last Refill: 01/24/23 #30 and 5RF  Please Advise

## 2023-11-29 IMAGING — US US FNA BIOPSY THYROID 1ST LESION
1 series · 13 of 13 positions shown · non-contrast
Comparison: 12/10/2020

INDICATION: Indeterminate thyroid nodule

EXAM:
ULTRASOUND GUIDED FINE NEEDLE ASPIRATION OF INDETERMINATE THYROID
NODULE
TECHNIQUE: Informed written consent was obtained from the patient after a
discussion of the risks, benefits and alternatives to treatment.
Questions regarding the procedure were encouraged and answered. A
timeout was performed prior to the initiation of the procedure.

[Series 1: us fna biopsy thyroid 1st lesion · 0.08mm/px · 13 acquisitions, 13 frames shown]
[im 1/13]
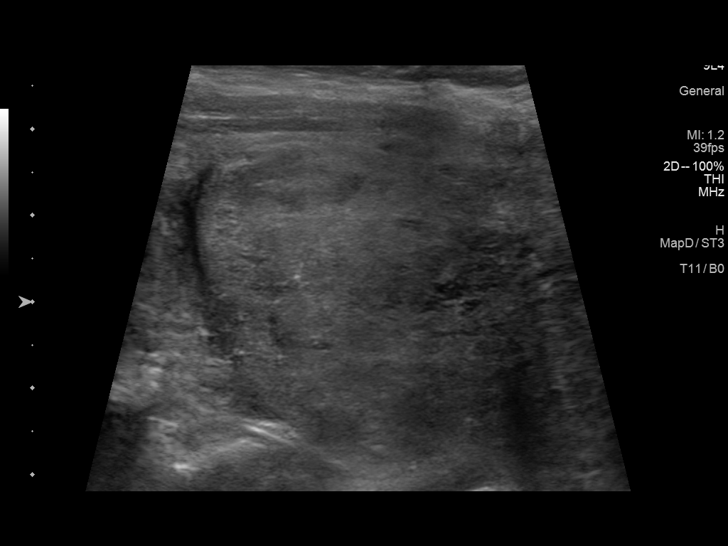
[im 2/13]
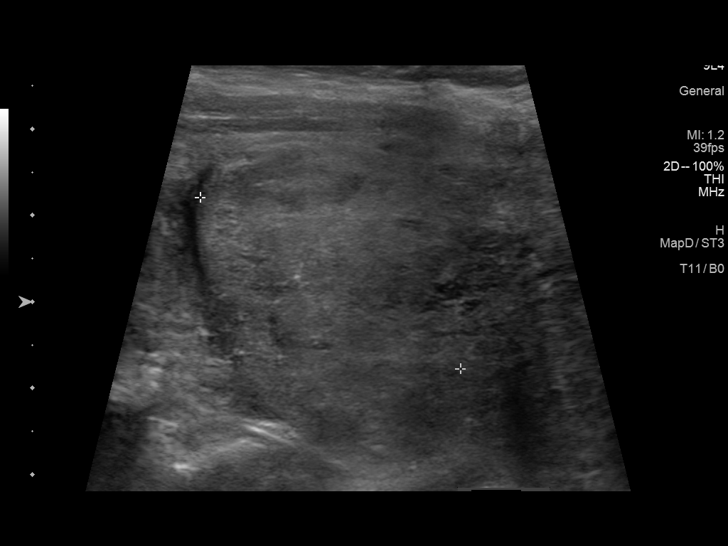
[im 3/13]
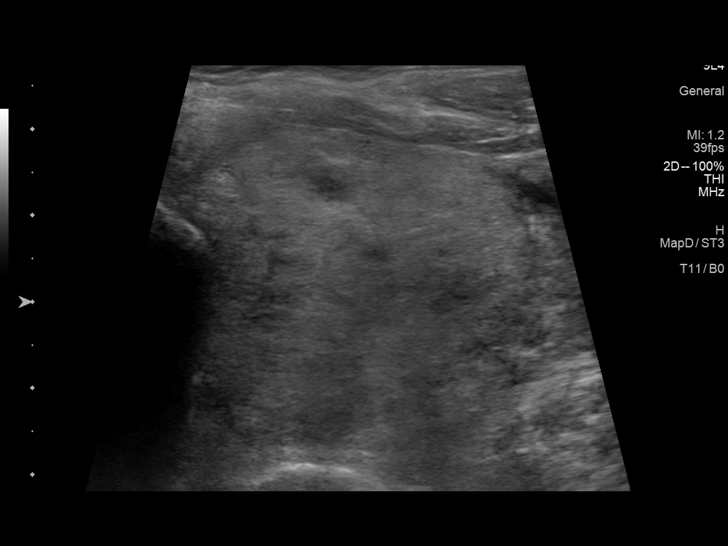
[im 4/13]
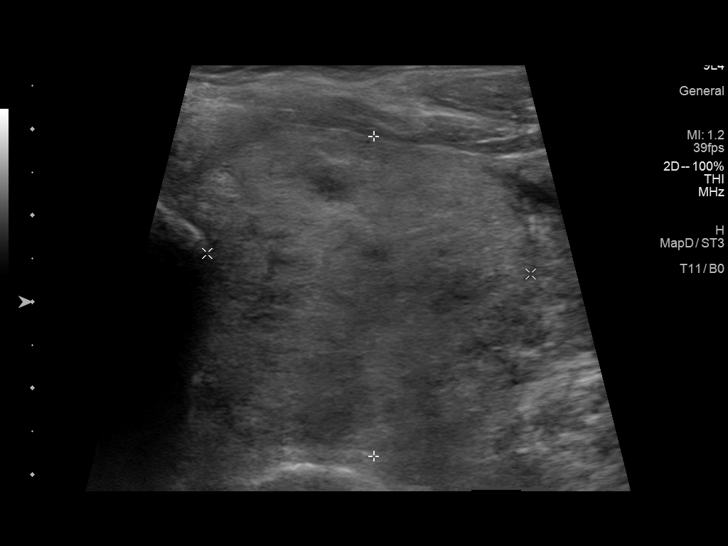
[im 5/13]
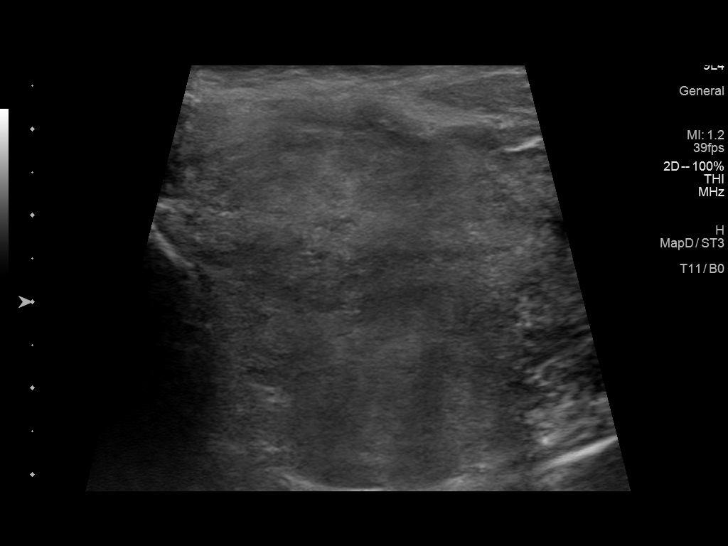
[im 6/13]
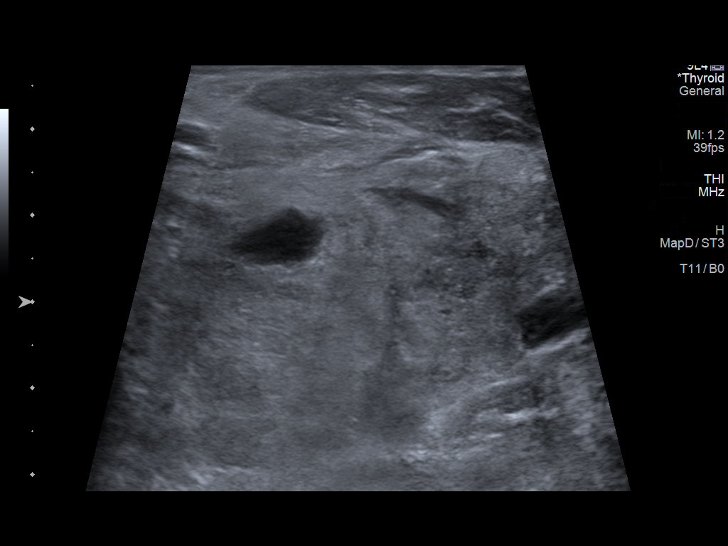
[im 7/13]
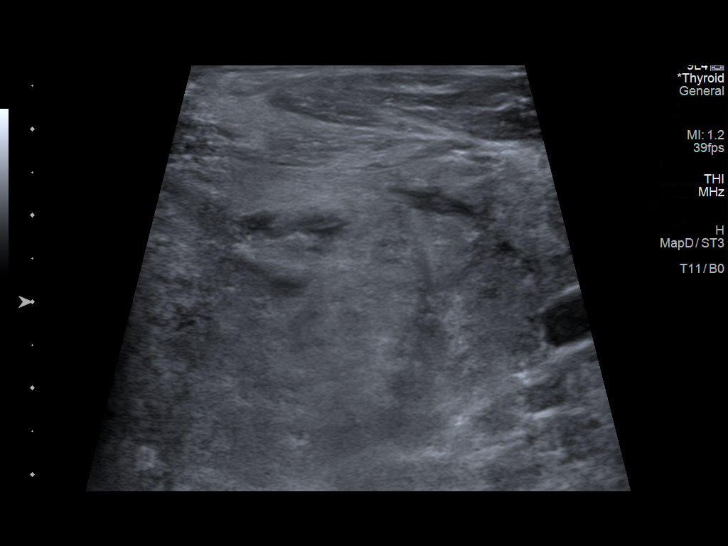
[im 8/13]
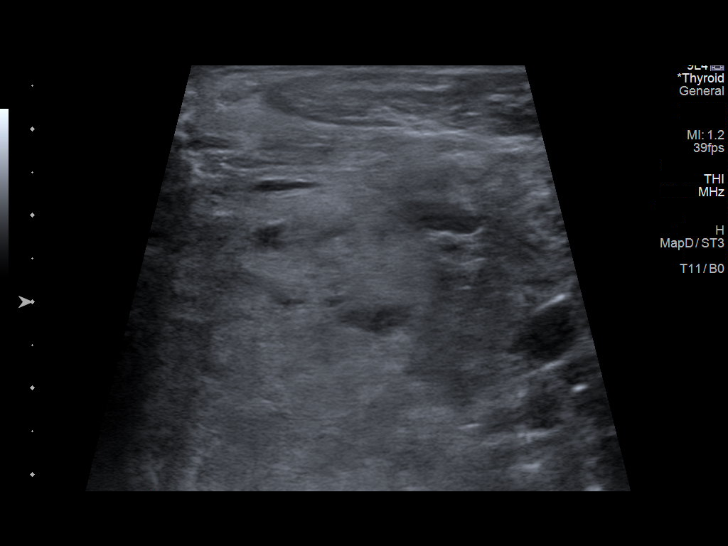
[im 9/13]
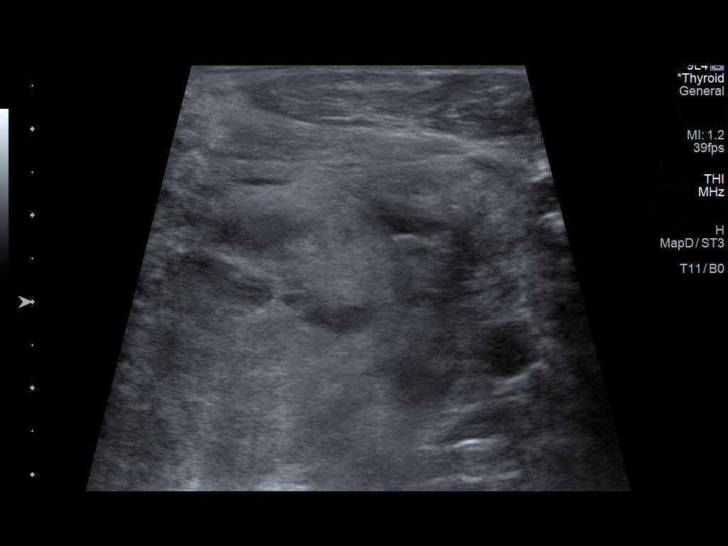
[im 10/13]
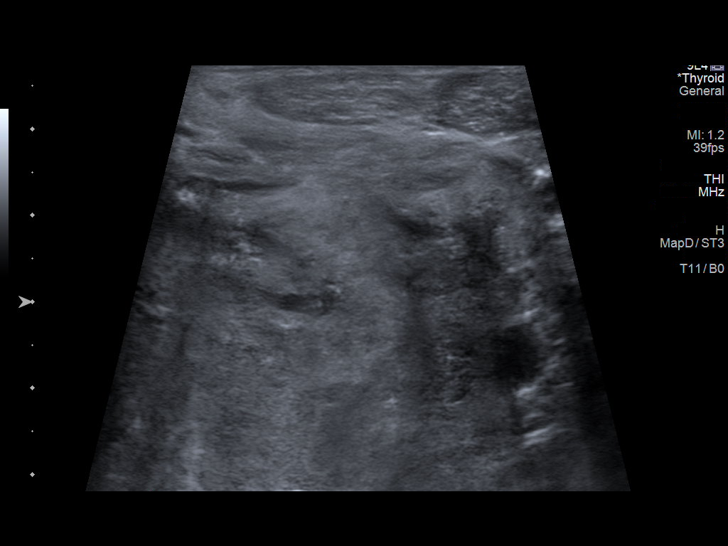
[im 11/13]
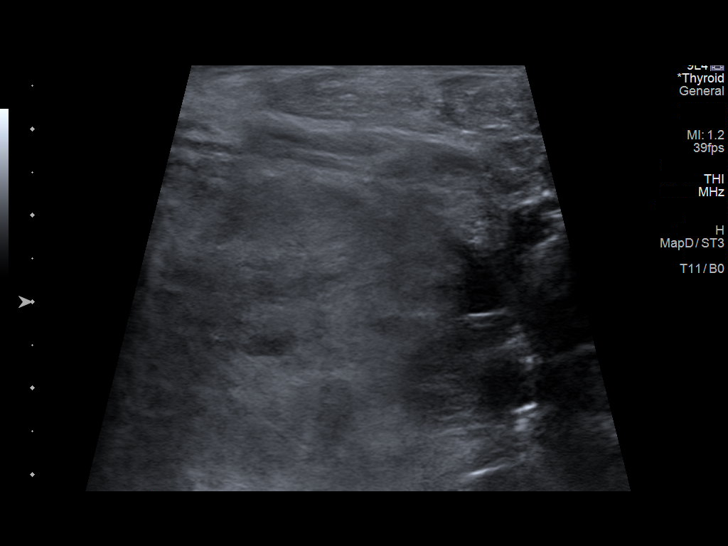
[im 12/13]
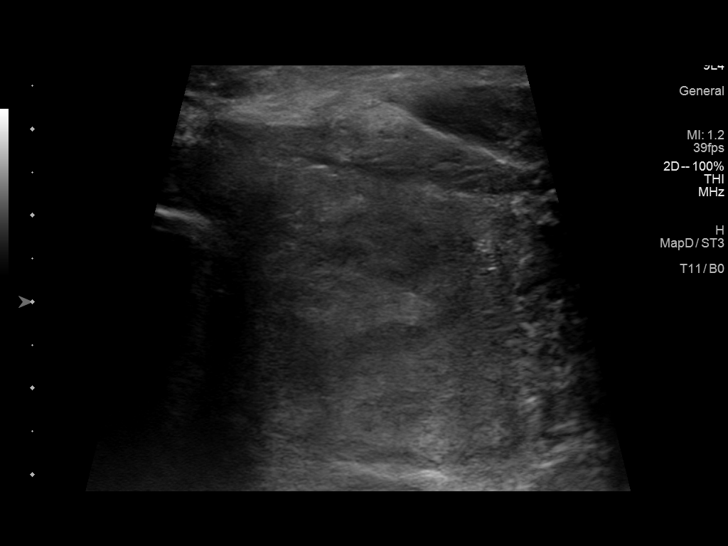
[im 13/13]
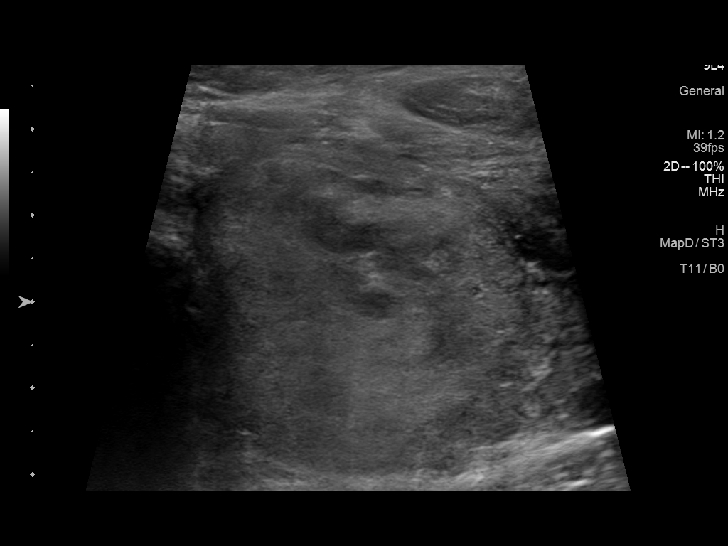

[13 of 13 positions shown; findings below may reference images not displayed]

05/28/2020

01/14/2021

12/28/2019

MEDICATIONS:
None

COMPLICATIONS:
None immediate.
Pre-procedural ultrasound scanning demonstrated unchanged size and
appearance of the indeterminate nodule within the inferior left
thyroid lobe

The procedure was planned. The neck was prepped in the usual sterile
fashion, and a sterile drape was applied covering the operative
field. A timeout was performed prior to the initiation of the
procedure. Local anesthesia was provided with 1% lidocaine.

Under direct ultrasound guidance, 6 FNA biopsies were performed of
the left inferior thyroid nodule with a 25 gauge needle. Multiple
ultrasound images were saved for procedural documentation purposes.
The samples were prepared and submitted to pathology.

Limited post procedural scanning was negative for hematoma or
additional complication. Dressings were placed. The patient
tolerated the above procedures procedure well without immediate
postprocedural complication.
FINDINGS: Nodule reference number based on prior diagnostic ultrasound: 5

Maximum size:

Location: Left; inferior

ACR TI-RADS risk category: TR3 (3 points)

Reason for biopsy: nondiagnostic on prior biopsy

Ultrasound imaging confirms appropriate placement of the needles
within the thyroid nodule.
IMPRESSION: Technically successful ultrasound guided fine needle aspiration of
TI-RADS 3 left inferior thyroid nodule (nodule 5)

## 2023-12-14 ENCOUNTER — Telehealth: Payer: Self-pay

## 2023-12-14 ENCOUNTER — Ambulatory Visit (INDEPENDENT_AMBULATORY_CARE_PROVIDER_SITE_OTHER): Payer: Managed Care, Other (non HMO) | Admitting: Internal Medicine

## 2023-12-14 ENCOUNTER — Encounter: Payer: Self-pay | Admitting: Internal Medicine

## 2023-12-14 VITALS — BP 130/84 | HR 90 | Temp 98.1°F | Resp 16 | Ht 72.0 in | Wt 195.2 lb

## 2023-12-14 DIAGNOSIS — E785 Hyperlipidemia, unspecified: Secondary | ICD-10-CM

## 2023-12-14 DIAGNOSIS — Z0000 Encounter for general adult medical examination without abnormal findings: Secondary | ICD-10-CM | POA: Diagnosis not present

## 2023-12-14 DIAGNOSIS — Z79899 Other long term (current) drug therapy: Secondary | ICD-10-CM | POA: Diagnosis not present

## 2023-12-14 DIAGNOSIS — F419 Anxiety disorder, unspecified: Secondary | ICD-10-CM | POA: Diagnosis not present

## 2023-12-14 LAB — COMPREHENSIVE METABOLIC PANEL WITH GFR
ALT: 19 U/L (ref 0–53)
AST: 19 U/L (ref 0–37)
Albumin: 4.5 g/dL (ref 3.5–5.2)
Alkaline Phosphatase: 58 U/L (ref 39–117)
BUN: 10 mg/dL (ref 6–23)
CO2: 32 meq/L (ref 19–32)
Calcium: 9.1 mg/dL (ref 8.4–10.5)
Chloride: 101 meq/L (ref 96–112)
Creatinine, Ser: 0.76 mg/dL (ref 0.40–1.50)
GFR: 96.71 mL/min (ref >60.00)
Glucose, Bld: 96 mg/dL (ref 70–99)
Potassium: 4.4 meq/L (ref 3.5–5.1)
Sodium: 139 meq/L (ref 135–145)
Total Bilirubin: 0.6 mg/dL (ref 0.2–1.2)
Total Protein: 6.8 g/dL (ref 6.0–8.3)

## 2023-12-14 LAB — LIPID PANEL
Cholesterol: 206 mg/dL — ABNORMAL HIGH (ref 0–200)
HDL: 46.3 mg/dL (ref >39.00)
LDL Cholesterol: 146 mg/dL — ABNORMAL HIGH (ref 0–99)
NonHDL: 159.54
Total CHOL/HDL Ratio: 4
Triglycerides: 70 mg/dL (ref 0.0–149.0)
VLDL: 14 mg/dL (ref 0.0–40.0)

## 2023-12-14 LAB — CBC WITH DIFFERENTIAL/PLATELET
Basophils Absolute: 0 K/uL (ref 0.0–0.1)
Basophils Relative: 0.8 % (ref 0.0–3.0)
Eosinophils Absolute: 0.1 K/uL (ref 0.0–0.7)
Eosinophils Relative: 2.8 % (ref 0.0–5.0)
HCT: 48.6 % (ref 39.0–52.0)
Hemoglobin: 16.5 g/dL (ref 13.0–17.0)
Lymphocytes Relative: 25.1 % (ref 12.0–46.0)
Lymphs Abs: 1.2 K/uL (ref 0.7–4.0)
MCHC: 33.9 g/dL (ref 30.0–36.0)
MCV: 86.2 fl (ref 78.0–100.0)
Monocytes Absolute: 0.5 K/uL (ref 0.1–1.0)
Monocytes Relative: 9.8 % (ref 3.0–12.0)
Neutro Abs: 3.1 K/uL (ref 1.4–7.7)
Neutrophils Relative %: 61.5 % (ref 43.0–77.0)
Platelets: 173 K/uL (ref 150.0–400.0)
RBC: 5.64 Mil/uL (ref 4.22–5.81)
RDW: 13.3 % (ref 11.5–15.5)
WBC: 5 K/uL (ref 4.0–10.5)

## 2023-12-14 LAB — PSA: PSA: 3.55 ng/mL (ref 0.10–4.00)

## 2023-12-14 MED ORDER — ONDANSETRON HCL 4 MG PO TABS: 4.0000 mg | ORAL_TABLET | Freq: Three times a day (TID) | ORAL | 0 refills | Status: AC | PRN

## 2023-12-14 NOTE — Progress Notes (Signed)
 Subjective:    Patient ID: Trevor Hurst, male    DOB: [birth year], 62 y.o.   MRN: [id number]  DOS:  12/14/2023 Type of visit - description: CPX  Here for CPX. Feeling well.  Has no concerns. Goiter has not cause any symptoms at this point.  Wt Readings from Last 3 Encounters:  12/14/23 195 lb 4 oz (88.6 kg)  12/13/22 198 lb 4 oz (89.9 kg)  12/09/21 201 lb (91.2 kg)    Review of Systems   A 14 point review of systems is negative    Past Medical History:  Diagnosis Date   Anxiety    Complication of anesthesia    Hyperlipidemia    PONV (postoperative nausea and vomiting)     Past Surgical History:  Procedure Laterality Date   HERNIA REPAIR  12 yrs ago   R    INGUINAL HERNIA REPAIR  12/24/2011   Procedure: LAPAROSCOPIC BILATERAL INGUINAL HERNIA REPAIR;  Surgeon: Camellia CHRISTELLA Blush, MD,FACS;  Location: [facility];  Service: General;;  DR BLUSH SPOKE WITH PT WIFE ABOUT REPAIRING THE UMBILICAL AND LEFT INGUINAL HERNIA BOTH WITH MESH .  ALL  RISKS WERE DISCUSSED WITH  HER OVER THE PHONE.  WIFE INSTRUCTED HIM TO PROCEED WITH REPAIRING THE LEFT INGUINAL AND UMBILICAL HERNIA    INSERTION OF MESH N/A 02/24/2016   Procedure: INSERTION OF MESH;  Surgeon: Camellia Blush, MD;  Location: [facility];  Service: General;  Laterality: N/A;   UMBILICAL HERNIA REPAIR  12/24/2011   Procedure: HERNIA REPAIR UMBILICAL ADULT;  Surgeon: Camellia CHRISTELLA Blush, MD,FACS;  Location: [facility];  Service: General;  Laterality: N/A;   VENTRAL HERNIA REPAIR N/A 02/24/2016   Procedure: LAPAROSCOPIC ASSISTED REPAIR OF SUPRAUMBILICAL  HERNIA WITH MESH;  Surgeon: Camellia Blush, MD;  Location: [facility];  Service: General;  Laterality: N/A;   warts  12/23/11   dermatologist froze them on back calfs bilaterally   Social History   Socioeconomic History   Marital status: Married    Spouse name: Debbie   Number of children: 2   Years of education: Not on file   Highest education level: Not on file  Occupational History   Occupation: IT     Employer: [employer]  Tobacco Use   Smoking status: Former    Current packs/day: 0.00    Average packs/day: 1 pack/day for 10.0 years (10.0 ttl pk-yrs)    Types: Cigarettes    Start date: 04/29/2001    Quit date: 04/30/2011    Years since quitting: 12.6   Smokeless tobacco: Never   Tobacco comments:    smoked 1979-2012 , ~ 1 ppd. Smoked total 29 years.  Substance and Sexual Activity   Alcohol use: No    Alcohol/week: 1.0 standard drink of alcohol    Types: 1 Standard drinks or equivalent per week   Drug use: No   Sexual activity: Not on file  Other Topics Concern   Not on file  Social History Narrative   Lives w/ wife;   2 children graduated   Social Drivers of Corporate investment banker Strain: Not on file  Food Insecurity: Not on file  Transportation Needs: Not on file  Physical Activity: Not on file  Stress: Not on file  Social Connections: Not on file  Intimate Partner Violence: Not on file    Current Outpatient Medications  Medication Instructions   b complex vitamins capsule 1 capsule, Daily with lunch   Cholecalciferol (VITAMIN D-3) 1000 units  CAPS 1 capsule, Daily   clobetasol ointment (TEMOVATE) 0.05 % 1 application , Every other day   cyclobenzaprine  (FLEXERIL ) 10 mg, Oral, At bedtime PRN   fexofenadine (ALLEGRA) 180 mg, Daily   LORazepam  (ATIVAN ) 0.5 MG tablet TAKE 1 TABLET BY MOUTH EVERY DAY AT BEDTIME AS NEEDED FOR SLEEP   nicotine polacrilex (NICORETTE) 4 mg, 4 times daily PRN   ondansetron  (ZOFRAN ) 4 mg, Oral, Every 8 hours PRN   sildenafil  (VIAGRA ) 50-100 mg, Oral, Daily PRN       Objective:   Physical Exam BP 130/84   Pulse 90   Temp 98.1 F (36.7 C) (Oral)   Resp 16   Ht 6' (1.829 m)   Wt 195 lb 4 oz (88.6 kg)   SpO2 97%   BMI 26.48 kg/m  General: Well developed, NAD, BMI noted Neck: + Enlarged thyroid  gland, nontender. HEENT:  Normocephalic . Face symmetric, atraumatic Lungs:  CTA B Normal respiratory effort, no intercostal  retractions, no accessory muscle use. Heart: RRR,  no murmur.  Abdomen:  Not distended, soft, non-tender. No rebound or rigidity.   Lower extremities: no pretibial edema bilaterally  Skin: Exposed areas without rash. Not pale. Not jaundice Neurologic:  alert & oriented X3.  Speech normal, gait appropriate for age and unassisted Strength symmetric and appropriate for age.  Psych: Cognition and judgment appear intact.  Cooperative with normal attention span and concentration.  Behavior appropriate. No anxious or depressed appearing.     Assessment     ASSESSMENT Anxiety, insomnia -- diazepam qhs, also uses nicotine gum at night Nicotine use gum but no tobacco per se Hyperlipidemia Psoriasis (per derm) -- clobetasol per derm Pre-ca skin lesion R knee Multinodular goiter, follow-up Endo, surgery LBP- flexeril  prn  PLAN: Here for CPX -Td 2016 - s/p shingrix  -Vaccines I recommend: COVID booster , Flu shot every fall. -CCS cscope 11-2014, wnl , 10 years per report -Prostate cancer screening:  no symptoms, check PSA -Former smoker, + FH lung cancer, lung cancer screening discussed before.  See previous entries -Diet-exercise: Doing great, continue exercising regularly, has improve his diet even more compared to last year. -Labs: CMP FLP CBC PSA  Other issues addressed today insomnia: On diazepam nightly; works well for him, RF as needed Hyperlipidemia: Doing great with his diet, current cardiovascular risk 9.8%.  Borderline indication for statins.  He would like to continue managing w/ diet if possible.  I provided information about the option of a coronary calcium score. Multinodular goiter: Asymptomatic, recommend to reach out to surgery to see if he needs further ultrasounds. RTC 1 year

## 2023-12-14 NOTE — Telephone Encounter (Signed)
Spoke w/ Pt- informed that form is ready for pick up at front desk. Copy sent for scanning.  ?

## 2023-12-14 NOTE — Assessment & Plan Note (Signed)
 Here for CPX -Td 2016 - s/p shingrix  -Vaccines I recommend: COVID booster , Flu shot every fall. -CCS cscope 11-2014, wnl , 10 years per report -Prostate cancer screening:  no symptoms, check PSA -Former smoker, + FH lung cancer, lung cancer screening discussed before.  See previous entries -Diet-exercise: Doing great, continue exercising regularly, has improve his diet even more compared to last year. -Labs: CMP FLP CBC PSA

## 2023-12-14 NOTE — Assessment & Plan Note (Signed)
 Here for CPX   Other issues addressed today insomnia: On diazepam nightly; works well for him, RF as needed Hyperlipidemia: Doing great with his diet, current cardiovascular risk 9.8%.  Borderline indication for statins.  He would like to continue managing w/ diet if possible.  I provided information about the option of a coronary calcium score. Multinodular goiter: Asymptomatic, recommend to reach out to surgery to see if he needs further ultrasounds. RTC 1 year

## 2023-12-14 NOTE — Patient Instructions (Signed)
 Vaccines I recommend: Flu shot COVID booster  GO TO THE LAB :  Get the blood work   Your results will be posted on MyChart with my comments  Next office visit for a physical exam in 1 year Please make an appointment before you leave today   To calculate your own cardiovascular risk factor in 10 years you  use the following tool Risk factors http://www.cvriskcalculator.com/   Please read about coronary calcium score at the [facility] website

## 2023-12-16 ENCOUNTER — Ambulatory Visit: Payer: Self-pay | Admitting: Internal Medicine

## 2023-12-16 LAB — DRUG MONITORING PANEL 375977 , URINE
Alcohol Metabolites: NEGATIVE ng/mL (ref <500)
Alphahydroxyalprazolam: NEGATIVE ng/mL (ref <25)
Alphahydroxymidazolam: NEGATIVE ng/mL (ref <50)
Alphahydroxytriazolam: NEGATIVE ng/mL (ref <50)
Aminoclonazepam: NEGATIVE ng/mL (ref <25)
Amphetamines: NEGATIVE ng/mL (ref <500)
Barbiturates: NEGATIVE ng/mL (ref <300)
Benzodiazepines: POSITIVE ng/mL — AB (ref <100)
Cocaine Metabolite: NEGATIVE ng/mL (ref <150)
Desmethyltramadol: NEGATIVE ng/mL (ref <100)
Hydroxyethylflurazepam: NEGATIVE ng/mL (ref <50)
Lorazepam: 328 ng/mL — ABNORMAL HIGH (ref <50)
Marijuana Metabolite: NEGATIVE ng/mL (ref <20)
Nordiazepam: NEGATIVE ng/mL (ref <50)
Opiates: NEGATIVE ng/mL (ref <100)
Oxazepam: NEGATIVE ng/mL (ref <50)
Oxycodone: NEGATIVE ng/mL (ref <100)
Temazepam: NEGATIVE ng/mL (ref <50)
Tramadol: NEGATIVE ng/mL (ref <100)

## 2023-12-16 LAB — DM TEMPLATE

## 2023-12-17 ENCOUNTER — Telehealth: Payer: Self-pay | Admitting: Internal Medicine

## 2023-12-18 ENCOUNTER — Encounter: Payer: Self-pay | Admitting: Internal Medicine

## 2023-12-19 NOTE — Telephone Encounter (Signed)
 Requesting: lorazepam  0.5mg   Contract: 12/22/22 UDS: 12/14/23 Last Visit: 12/14/23 Next Visit: 12/14/24 Last Refill: 07/25/23 #30 and 4RF  Please Advise

## 2023-12-19 NOTE — Telephone Encounter (Signed)
 PDMP okay, Rx sent

## 2024-02-23 ENCOUNTER — Encounter: Payer: Self-pay | Admitting: Internal Medicine

## 2024-03-22 ENCOUNTER — Other Ambulatory Visit: Payer: Self-pay | Admitting: Internal Medicine

## 2024-12-14 ENCOUNTER — Encounter: Admitting: Internal Medicine
